# Patient Record
Sex: Male | Born: 1965 | Race: White | Hispanic: No | Marital: Married | Smoking: Never smoker
Health system: Southern US, Community
[De-identification: ages and names within clinical notes are randomized; demographics above are authoritative.]

## PROBLEM LIST (undated history)

## (undated) DIAGNOSIS — T7840XA Allergy, unspecified, initial encounter: Secondary | ICD-10-CM

## (undated) DIAGNOSIS — E785 Hyperlipidemia, unspecified: Secondary | ICD-10-CM

## (undated) DIAGNOSIS — I10 Essential (primary) hypertension: Secondary | ICD-10-CM

## (undated) HISTORY — PX: POLYPECTOMY: SHX149

## (undated) HISTORY — DX: Hyperlipidemia, unspecified: E78.5

## (undated) HISTORY — DX: Allergy, unspecified, initial encounter: T78.40XA

## (undated) HISTORY — PX: RECONSTRUCTION OF EYELID: SHX6576

## (undated) HISTORY — DX: Essential (primary) hypertension: I10

## (undated) HISTORY — PX: COLONOSCOPY: SHX174

---

## 2004-03-05 ENCOUNTER — Emergency Department (HOSPITAL_COMMUNITY): Admission: EM | Admit: 2004-03-05 | Discharge: 2004-03-05 | Payer: Self-pay | Admitting: Emergency Medicine

## 2008-06-17 ENCOUNTER — Encounter: Admission: RE | Admit: 2008-06-17 | Discharge: 2008-06-17 | Payer: Self-pay | Admitting: Internal Medicine

## 2008-07-20 ENCOUNTER — Ambulatory Visit: Payer: Self-pay | Admitting: Cardiology

## 2008-08-24 ENCOUNTER — Ambulatory Visit: Payer: Self-pay | Admitting: Cardiology

## 2009-01-07 ENCOUNTER — Ambulatory Visit: Payer: Self-pay | Admitting: Internal Medicine

## 2009-03-18 ENCOUNTER — Ambulatory Visit: Payer: Self-pay | Admitting: Internal Medicine

## 2009-09-24 ENCOUNTER — Ambulatory Visit: Payer: Self-pay | Admitting: Internal Medicine

## 2010-04-26 ENCOUNTER — Ambulatory Visit: Payer: Self-pay | Admitting: Internal Medicine

## 2010-10-27 ENCOUNTER — Ambulatory Visit: Payer: Self-pay | Admitting: Internal Medicine

## 2011-04-11 ENCOUNTER — Other Ambulatory Visit: Payer: Self-pay | Admitting: Internal Medicine

## 2011-04-13 ENCOUNTER — Ambulatory Visit: Payer: Self-pay | Admitting: Internal Medicine

## 2011-04-25 NOTE — Assessment & Plan Note (Signed)
[facility]                            CARDIOLOGY OFFICE NOTE   NAME:Alfred Berry, Alfred Berry                         MRN:          [id number]  DATE:07/20/2008                            DOB:          [birth year]    PRIMARY CARE PHYSICIAN:  Luanna Cole. Baxley, MD   REASON FOR CONSULTATION:  Evaluate patient with shortness of breath.   HISTORY OF PRESENT ILLNESS:  The patient is a pleasant 45 year old white  gentleman without prior cardiac history.  He has been noticing shortness  of breath for some weeks.  This happens in the morning.  It happens when  he tries to exercise.  He says he gets on the treadmill at the gym and  he will notice that he is more dyspneic than he thinks he should be.  He  is able to do his activity and it sounds like he does work out  reasonably well.  He gets a less short of breath as the activity goes  on.  He does not have his much of the dyspnea during the day.  He does  have some episodes at night where he feels short of breath and has to  get up.  He is not describing classic PND or orthopnea.  He says he may  be anxious as well.  He does have some episodes of his chest being heavy  when he is short of breath.  He is not describing a classic substernal  chest pressure but rather a vague mild discomfort.  He has no neck or  arm discomfort.  He is not describing any palpitations, presyncope, or  syncope.  Again, he is an active gentleman exercising a few times a  week.   PAST MEDICAL HISTORY:  He has no history of hypertension, diabetes, or  hyperlipidemia.   PAST SURGICAL HISTORY:  Cosmetic surgery in his eyes.   ALLERGIES:  He was allergic to PENICILLIN substitution years ago.   MEDICATIONS:  Claritin-D.   SOCIAL HISTORY:  The patient is a Community education officer at [employer].  He is married.  He has never had any children.  He has never smoked  cigarettes.  He does not drink alcohol.   Family history is remarkable for his father  having coronary artery  disease at his 72s and being alive at 71 with CABG.   REVIEW OF SYSTEMS:  As stated in the HPI.  Negative for all other  systems.   PHYSICAL EXAMINATION:  GENERAL:  The patient is well appearing and in no  distress.  VITAL SIGNS:  Blood pressure 120/79, heart rate 66 and regular, weight  188 pounds, and body mass index 28.5.  HEENT:  Eyes are unremarkable.  Pupils are equal, round, and reactive to  light.  Fundi visualized.  Oral mucosa unremarkable.  NECK:  No jugular venous distention, at 45 degrees.  Carotid upstroke  brisk and symmetric.  No bruits and no thyromegaly.  LYMPHATICS:  No cervical, axillary, or inguinal adenopathy.  LUNGS:  Clear to auscultation bilaterally.  BACK:  No costovertebral tenderness.  CHEST:  Unremarkable.  HEART:  PMI not displaced or sustained and S1 and S2 within normal  limits.  No S3, no S4, no clicks, no rubs, and no murmurs.  ABDOMEN:  Flat, positive bowel sounds, and normal in frequency and  pitch.  No bruits, rebound, or guarding, no midline pulsatile mass, no  hepatosplenomegaly, and no splenomegaly.  SKIN:  No rashes and no nodules.  EXTREMITIES:  A 2+ pulses throughout.  No edema, cyanosis, or clubbing.  NEURO:  Oriented to person, place, and time.  Cranial nerves II through  XII grossly intact.  Motor grossly intact throughout.   EKG sinus rhythm, rate 73, axes within normal limits, intervals within  normal limits, and nonspecific T-wave flattening in inferior and lateral  leads.   ASSESSMENT AND PLAN:  1. Dyspnea.  The patient has some dyspnea, that is, somewhat atypical.      He does have a strong family history of coronary artery disease.  I      could not exclude obstructive coronary artery disease as an      etiology though I think the pretest probability is somewhat low.  I      am going to perform a plain old exercise treadmill (PLET ).  This      will allow me to judge his dyspnea and check oxygen sats  while he      is ambulating.  This will let me to look at his blood pressure to      make sure this is not going up and to screen for obstructive      coronary artery disease.  More importantly, it will allow me to      risk stratify and most importantly give him prescription for      exercise.  With that family history, I would pursue aggressive      primary risk reduction.  2. Dyslipidemia.  He has an LDL of 147.  His HDL was 43.      Triglycerides 123 and total 215.  We discussed the risks and      benefits of statins given his family history.  Though it is outside      of the guidelines, I would suggest statin therapy if he could not      substantially reduce his LDL by diet and lifestyle changes.  He is      going to consider this and we will discuss it again at the time of      his PLET.  3. Followup.  As above.     Rollene Rotunda, MD, [facility]  Electronically Signed    JH/MedQ  DD: 07/20/2008  DT: 07/21/2008  Job #: [id number]   cc:   Luanna Cole. Lenord Fellers, M.D.

## 2011-04-25 NOTE — Procedures (Signed)
[facility]                              EXERCISE TREADMILL   NAME:Berry, Alfred                         MRN:          [id number]  DATE:08/24/2008                            DOB:          11/01/1966    PRIMARY CARE PHYSICIAN:  Luanna Cole. Baxley, MD   PROCEDURE:  Exercise treadmill test.   INDICATIONS:  The patient with dyspnea and on multiple cardiovascular  risk factors.   PROCEDURE NOTE:  The patient was excised using an accelerated Bruce  protocol.  He achieved 13.7 METs.  He exercises for 11 minutes.  He  completed stage IV.  He achieved target heart rate of 100 with a peak of  166, which was 93% of predicted.  Test was terminated because he  achieved target heart rate and because of dyspnea.  He could have  continued.   There was no chest discomfort.  He had appropriate dyspnea.  He had an  appropriate blood pressure response with max of 184/85.  There were no  ectopy.  There was no ischemic ST-T wave changes.  He had a normal heart  rate recovery.   CONCLUSION:  Negative adequate exercise treadmill test with an excellent  exercise tolerance.  There were no findings consistent with high-grade  obstructive coronary artery disease.   PLAN:  Based on the above, the patient is in low-risk category for  future cardiovascular events.  He did not have any dyspnea, which has  been his complaint.  I told him that if he continues to have this  complaint with exercise, we might consider cardiopulmonary stress test.  He could have something like exercise-induced asthma.  For now, he has  prescription for exercise based on this study.  He can continue with his  regimen and primary risk reduction.  He is going to have his lipids  checked again by Dr. Lenord Fellers after he has been dieting.  Consideration  can then be given to any further therapy for this given his family  history.  He will come back to this clinic as needed for issues as  above.     Rollene Rotunda, MD, [facility]  Electronically Signed    JH/MedQ  DD: 08/24/2008  DT: 08/25/2008  Job #: [id number]   cc:   Luanna Cole. Lenord Fellers, M.D.

## 2011-05-01 ENCOUNTER — Encounter: Payer: Self-pay | Admitting: Internal Medicine

## 2011-05-01 ENCOUNTER — Ambulatory Visit (INDEPENDENT_AMBULATORY_CARE_PROVIDER_SITE_OTHER): Payer: BC Managed Care – PPO | Admitting: Internal Medicine

## 2011-05-01 VITALS — BP 110/80 | HR 80 | Temp 98.3°F | Wt 193.0 lb

## 2011-05-01 DIAGNOSIS — E785 Hyperlipidemia, unspecified: Secondary | ICD-10-CM

## 2011-05-01 NOTE — Patient Instructions (Signed)
Take meds as directed. Return in 6 months 

## 2011-05-01 NOTE — Progress Notes (Signed)
  Subjective:    Patient ID: Alfred Berry, male    DOB: [birth year], 45 y.o.   MRN: [id number]  HPI  For 6 month follow up of hyperlipidemia on generic Zocor 20 mg daily. No complaints on medicine. Previous weight 6 months ago was 1871/2.  Going to [place] for 7 weeks.    Review of Systems     Objective:   Physical Exam  Chest clear. Cor RRR with no murmur. Ext without edema.        Assessment & Plan:  1- Hyperlipidemia stable on current regimen.  Labs reviewed LDL is 105 and prviously was 94

## 2011-08-15 ENCOUNTER — Other Ambulatory Visit: Payer: Self-pay | Admitting: *Deleted

## 2011-08-15 MED ORDER — SIMVASTATIN 20 MG PO TABS: 20.0000 mg | ORAL_TABLET | Freq: Every day | ORAL | Status: DC

## 2011-11-07 ENCOUNTER — Other Ambulatory Visit: Payer: BC Managed Care – PPO | Admitting: Internal Medicine

## 2011-11-08 ENCOUNTER — Other Ambulatory Visit (INDEPENDENT_AMBULATORY_CARE_PROVIDER_SITE_OTHER): Payer: BC Managed Care – PPO | Admitting: Internal Medicine

## 2011-11-08 DIAGNOSIS — E785 Hyperlipidemia, unspecified: Secondary | ICD-10-CM

## 2011-11-08 DIAGNOSIS — Z79899 Other long term (current) drug therapy: Secondary | ICD-10-CM

## 2011-11-08 DIAGNOSIS — Z0000 Encounter for general adult medical examination without abnormal findings: Secondary | ICD-10-CM

## 2011-11-08 LAB — COMPREHENSIVE METABOLIC PANEL
ALT: 24 U/L (ref 0–53)
AST: 22 U/L (ref 0–37)
Alkaline Phosphatase: 31 U/L — ABNORMAL LOW (ref 39–117)
Creat: 0.96 mg/dL (ref 0.50–1.35)
Total Bilirubin: 0.7 mg/dL (ref 0.3–1.2)

## 2011-11-08 LAB — LIPID PANEL
LDL Cholesterol: 104 mg/dL — ABNORMAL HIGH (ref 0–99)
Total CHOL/HDL Ratio: 3.6 Ratio
VLDL: 23 mg/dL (ref 0–40)

## 2011-11-09 ENCOUNTER — Ambulatory Visit (INDEPENDENT_AMBULATORY_CARE_PROVIDER_SITE_OTHER): Payer: BC Managed Care – PPO | Admitting: Internal Medicine

## 2011-11-09 VITALS — BP 138/84 | Temp 97.5°F | Ht 67.5 in | Wt 188.0 lb

## 2011-11-09 DIAGNOSIS — E785 Hyperlipidemia, unspecified: Secondary | ICD-10-CM

## 2011-11-09 DIAGNOSIS — Z0000 Encounter for general adult medical examination without abnormal findings: Secondary | ICD-10-CM

## 2011-11-09 DIAGNOSIS — Z23 Encounter for immunization: Secondary | ICD-10-CM

## 2011-11-09 LAB — CBC WITH DIFFERENTIAL/PLATELET
Basophils Absolute: 0 10*3/uL (ref 0.0–0.1)
Eosinophils Absolute: 0.1 10*3/uL (ref 0.0–0.7)
Eosinophils Relative: 2 % (ref 0–5)
MCH: 31.6 pg (ref 26.0–34.0)
MCV: 93.7 fL (ref 78.0–100.0)
Neutrophils Relative %: 64 % (ref 43–77)
Platelets: 267 10*3/uL (ref 150–400)
RDW: 12.9 % (ref 11.5–15.5)
WBC: 6.1 10*3/uL (ref 4.0–10.5)

## 2011-11-09 LAB — POCT URINALYSIS DIPSTICK
Spec Grav, UA: 1.02
Urobilinogen, UA: NEGATIVE

## 2011-11-12 ENCOUNTER — Encounter: Payer: Self-pay | Admitting: Internal Medicine

## 2011-11-12 DIAGNOSIS — E785 Hyperlipidemia, unspecified: Secondary | ICD-10-CM | POA: Insufficient documentation

## 2011-11-12 NOTE — Patient Instructions (Signed)
Continue with Zocor as previously prescribed and return in 6 months for fasting lab work and office visit. You have been given influenza immunization today. Try the diet exercise and lose weight

## 2011-11-12 NOTE — Progress Notes (Signed)
  Subjective:    Patient ID: Alfred Berry, male    DOB: [birth year], 45 y.o.   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 with history of hyperlipidemia and 4 evaluation. Patient currently on Zocor 20 mg daily since January 2010. History of mild obesity. History of borderline hypertension. History of allergic rhinitis. Had cosmetic eye surgery 1975.  Social history does not smoke social alcohol consumption. Married to our history professor who also teaches at [employer]. No children.  Family history father with history of coronary artery bypass surgery in 2002. Patient's father was adopted so not much known about paternal family history. Mother living in good health. Father had history of hypertension.    Review of Systems  Constitutional: Negative.   HENT: Negative.   Eyes: Negative.   Respiratory: Negative.   Cardiovascular: Negative.   Gastrointestinal: Negative.   Genitourinary: Negative.   Musculoskeletal: Negative.   Neurological: Negative.   Hematological: Negative.   Psychiatric/Behavioral: Negative.        Objective:   Physical Exam  Vitals reviewed. Constitutional: He is oriented to person, place, and time. He appears well-nourished.  HENT:  Head: Normocephalic.  Right Ear: External ear normal.  Left Ear: External ear normal.  Mouth/Throat: Oropharynx is clear and moist.  Eyes: Conjunctivae are normal. Pupils are equal, round, and reactive to light. No scleral icterus.  Neck: Neck supple. No JVD present. No thyromegaly present.  Cardiovascular: Normal rate, regular rhythm, normal heart sounds and intact distal pulses.   No murmur heard. Pulmonary/Chest: Effort normal and breath sounds normal. He has no wheezes. He has no rales.  Abdominal: Soft. Bowel sounds are normal. There is no tenderness. There is no rebound.  Genitourinary: Prostate normal.  Musculoskeletal: He exhibits no edema.  Lymphadenopathy:    He has no cervical  adenopathy.  Neurological: He is alert and oriented to person, place, and time. He has normal reflexes. No cranial nerve deficit. Coordination normal.  Skin: Skin is warm and dry.  Psychiatric: He has a normal mood and affect. His behavior is normal. Thought content normal.          Assessment & Plan:  Hyperlipidemia on statin therapy  Plan: Return in 6 months for fasting lipid panel liver functions and office visit. Patient needs to try the diet and exercise but hasn't had much time this semester. Needs to lose a will be away. We continued to monitor his blood pressure as it is borderline elevated.

## 2011-11-27 ENCOUNTER — Other Ambulatory Visit: Payer: Self-pay

## 2011-11-27 MED ORDER — SIMVASTATIN 20 MG PO TABS: 20.0000 mg | ORAL_TABLET | Freq: Every day | ORAL | Status: DC

## 2012-04-22 ENCOUNTER — Other Ambulatory Visit: Payer: BC Managed Care – PPO | Admitting: Internal Medicine

## 2012-04-22 DIAGNOSIS — E785 Hyperlipidemia, unspecified: Secondary | ICD-10-CM

## 2012-04-22 DIAGNOSIS — Z79899 Other long term (current) drug therapy: Secondary | ICD-10-CM

## 2012-04-22 LAB — HEPATIC FUNCTION PANEL
ALT: 16 U/L (ref 0–53)
Bilirubin, Direct: 0.1 mg/dL (ref 0.0–0.3)
Indirect Bilirubin: 0.6 mg/dL (ref 0.0–0.9)

## 2012-04-22 LAB — LIPID PANEL
Cholesterol: 193 mg/dL (ref 0–200)
VLDL: 20 mg/dL (ref 0–40)

## 2012-04-23 ENCOUNTER — Ambulatory Visit: Payer: BC Managed Care – PPO | Admitting: Internal Medicine

## 2012-04-26 ENCOUNTER — Ambulatory Visit (INDEPENDENT_AMBULATORY_CARE_PROVIDER_SITE_OTHER): Payer: BC Managed Care – PPO | Admitting: Internal Medicine

## 2012-04-26 ENCOUNTER — Encounter: Payer: Self-pay | Admitting: Internal Medicine

## 2012-04-26 VITALS — BP 124/78 | HR 80 | Temp 97.6°F | Wt 188.0 lb

## 2012-04-26 DIAGNOSIS — E785 Hyperlipidemia, unspecified: Secondary | ICD-10-CM

## 2012-04-26 DIAGNOSIS — S86912A Strain of unspecified muscle(s) and tendon(s) at lower leg level, left leg, initial encounter: Secondary | ICD-10-CM

## 2012-04-26 DIAGNOSIS — S83419A Sprain of medial collateral ligament of unspecified knee, initial encounter: Secondary | ICD-10-CM

## 2012-04-26 NOTE — Patient Instructions (Addendum)
Continue same dose of Zocor and return for PE in 6 months. Watch diet. Ice knee down for 20 minutes daily x 2 weeks.

## 2012-04-26 NOTE — Progress Notes (Signed)
  Subjective:    Patient ID: Alfred Berry, male    DOB: 1966-07-24, 46 y.o.   MRN: [id number]  HPI For follow up of hyperlipidemia and new complaint of left knee sprain. 2 weeks ago twisted left knee outward. Getting better.    Review of Systems     Objective:   Physical Exam No effusion. Tender along medial collateral ligament        Assessment & Plan:  Hyperlipidemia- LFTs normal but total cholesterol and LDL cholesterol has increased despite 20mg  Zocor. Has been given a different generic brand 2 weeks ago by pharmacy and has been eating cheese.  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  Continue same dose of   Zocor  for now. Watch diet, exercise. RTC in 6 months for CPE.

## 2012-05-24 ENCOUNTER — Ambulatory Visit (INDEPENDENT_AMBULATORY_CARE_PROVIDER_SITE_OTHER): Payer: BC Managed Care – PPO | Admitting: Internal Medicine

## 2012-05-24 ENCOUNTER — Encounter: Payer: Self-pay | Admitting: Internal Medicine

## 2012-05-24 VITALS — BP 124/78 | HR 80 | Temp 98.5°F | Ht 67.0 in | Wt 187.0 lb

## 2012-05-24 DIAGNOSIS — J309 Allergic rhinitis, unspecified: Secondary | ICD-10-CM

## 2012-05-24 DIAGNOSIS — H6691 Otitis media, unspecified, right ear: Secondary | ICD-10-CM

## 2012-05-24 DIAGNOSIS — H6690 Otitis media, unspecified, unspecified ear: Secondary | ICD-10-CM

## 2012-05-24 MED ORDER — METHYLPREDNISOLONE ACETATE 80 MG/ML IJ SUSP
80.0000 mg | Freq: Once | INTRAMUSCULAR | Status: AC
  Administered 2012-05-24: 80 mg via INTRAMUSCULAR

## 2012-05-24 NOTE — Progress Notes (Signed)
  Subjective:    Patient ID: Alfred Berry, male    DOB: 08-03-1966, 46 y.o.   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 Says that allergy symptoms were rather severe in [place]. When he returned home he noticed some pain in his right ear and sinus congestion which has persisted. This is been about a week. No fever or chills. No sore throat. No discolored nasal drainage. With regard to knee pain. He has been icing it Berry daily and the pain has improved    Review of Systems     Objective:   Physical Exam right TM is full but not red; left TM okay. Parents clear. Neck is supple without adenopathy. Chest clear.        Assessment & Plan:  Right otitis media-prescription for Zithromax Z-Pak take 2 tablets day one followed by 1 tablet days 2 through 5  Allergic rhinitis-plan is to treat with Depo-Medrol 80 mg IM given in office   Call if not better in one week.

## 2012-05-24 NOTE — Patient Instructions (Addendum)
He had been given an injection of Depo-Medrol for allergic rhinitis. Take Zithromax 2 tablets day one followed by 1 tablet days 2 through 5. Call if not better in one week.

## 2012-05-26 ENCOUNTER — Encounter: Payer: Self-pay | Admitting: Internal Medicine

## 2012-06-03 ENCOUNTER — Encounter: Payer: Self-pay | Admitting: Internal Medicine

## 2012-06-03 ENCOUNTER — Ambulatory Visit (INDEPENDENT_AMBULATORY_CARE_PROVIDER_SITE_OTHER): Payer: BC Managed Care – PPO | Admitting: Internal Medicine

## 2012-06-03 VITALS — BP 132/90 | Temp 98.0°F | Ht 67.0 in | Wt 187.0 lb

## 2012-06-03 DIAGNOSIS — H6120 Impacted cerumen, unspecified ear: Secondary | ICD-10-CM

## 2012-06-03 DIAGNOSIS — H6690 Otitis media, unspecified, unspecified ear: Secondary | ICD-10-CM

## 2012-06-03 DIAGNOSIS — H6122 Impacted cerumen, left ear: Secondary | ICD-10-CM

## 2012-06-03 DIAGNOSIS — J309 Allergic rhinitis, unspecified: Secondary | ICD-10-CM

## 2012-06-03 DIAGNOSIS — H6691 Otitis media, unspecified, right ear: Secondary | ICD-10-CM

## 2012-06-03 NOTE — Progress Notes (Signed)
  Subjective:    Patient ID: Dermot Gremillion, male    DOB: [birth year], 46 y.o.   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 He called saying left ear still felt intermittently stopped up. Still has considerable postnasal drip. At last visit was placed on a Zithromax Z-Pak and was given injection of Depo-Medrol IM for allergic rhinitis and right otitis media. Says it took several days however he began to feel better but intermittently over the past week has some issues with left ear being stopped up. No fever or chills. No discolored nasal drainage.    Review of Systems     Objective:   Physical Exam He has boggy nasal mucosa. Right TM slightly dull. Left TM obscured by large piece of cerumen. Patient underwent irrigation for some 20 minutes before cerumen was removed with a curette. It was a large irregular piece of cerumen. Left TM appears to be  normal. Pharynx is clear. Neck is supple. Chest is clear.        Assessment & Plan:  Impacted cerumen left external ear canal  Right otitis media-resolving  Allergic rhinitis  Plan: Sterapred DS 10 mg 6 day dosepak prescribed. Samples of Avelox 400 mg daily for 6 days. Samples of Nasonex nasal spray 2 sprays in each nostril daily. If symptoms persist, refer to allergist for allergy testing.

## 2012-06-03 NOTE — Patient Instructions (Addendum)
Take prednisone 10 mg dosepak as directed for 6 days. Take Avelox 400 mg daily for 6 days. Use Nasonex 2 sprays in each nostril daily. If symptoms persist, we will refer you to allergist. A large piece of cerumen was removed from your left ear canal today

## 2012-06-19 ENCOUNTER — Other Ambulatory Visit: Payer: Self-pay

## 2012-06-19 MED ORDER — SIMVASTATIN 20 MG PO TABS: 20.0000 mg | ORAL_TABLET | Freq: Every day | ORAL | Status: DC

## 2012-07-04 ENCOUNTER — Other Ambulatory Visit: Payer: Self-pay

## 2012-07-04 MED ORDER — MOMETASONE FUROATE 50 MCG/ACT NA SUSP: 2.0000 | Freq: Every day | NASAL | Status: DC

## 2012-08-02 ENCOUNTER — Encounter: Payer: Self-pay | Admitting: Internal Medicine

## 2012-08-02 ENCOUNTER — Ambulatory Visit (INDEPENDENT_AMBULATORY_CARE_PROVIDER_SITE_OTHER): Payer: BC Managed Care – PPO | Admitting: Internal Medicine

## 2012-08-02 VITALS — BP 136/90 | HR 80 | Temp 98.7°F | Ht 67.5 in | Wt 188.5 lb

## 2012-08-02 DIAGNOSIS — H938X9 Other specified disorders of ear, unspecified ear: Secondary | ICD-10-CM

## 2012-08-02 NOTE — Patient Instructions (Addendum)
Appointment with ENT physician to stay August 27. Continue Nasonex and over-the-counter antihistamine

## 2012-08-02 NOTE — Progress Notes (Signed)
  Subjective:    Patient ID: Alfred Berry, male    DOB: 02-Sep-1966, 46 y.o.   MRN: [id number]  HPI He is here today saying that he still feels like his ears are underwater. Has been using Nasonex once daily and taking over-the-counter antihistamine. Feels that his nasal congestion has improved but ears still feel stopped up although they did feel better for short period of time. We've been dealing this for some time now. He is planning a trip to [place] on Wednesday for a conference and thinks he may be in for an uncomfortable plane ride if his ears remain stopped up. He has never been allergy tested.    Review of Systems     Objective:   Physical Exam he has a very small amount of wax in the external ear canals that is not obstructing his hearing. Both TMs look to be normal to me. They are not red or full. He has boggy nasal mucosa bilaterally. His pharynx is clear. Neck is supple. Chest clear.        Assessment & Plan:  Persistent complaint of ear congestion  Plan: Refer next week to ENT physician before plane trip to [place]. Continue Nasonex and antihistamine over-the-counter. Consider allergy testing.

## 2012-11-12 ENCOUNTER — Other Ambulatory Visit: Payer: BC Managed Care – PPO | Admitting: Internal Medicine

## 2012-11-12 DIAGNOSIS — Z0000 Encounter for general adult medical examination without abnormal findings: Secondary | ICD-10-CM

## 2012-11-12 DIAGNOSIS — E785 Hyperlipidemia, unspecified: Secondary | ICD-10-CM

## 2012-11-13 LAB — LIPID PANEL
Cholesterol: 155 mg/dL (ref 0–200)
Total CHOL/HDL Ratio: 2.9 Ratio
Triglycerides: 53 mg/dL (ref <150)
VLDL: 11 mg/dL (ref 0–40)

## 2012-11-13 LAB — CBC WITH DIFFERENTIAL/PLATELET
Eosinophils Absolute: 0.1 10*3/uL (ref 0.0–0.7)
Eosinophils Relative: 1 % (ref 0–5)
Hemoglobin: 15.2 g/dL (ref 13.0–17.0)
Lymphs Abs: 1.6 10*3/uL (ref 0.7–4.0)
MCH: 31.7 pg (ref 26.0–34.0)
MCV: 94.4 fL (ref 78.0–100.0)
Monocytes Absolute: 0.4 10*3/uL (ref 0.1–1.0)
Monocytes Relative: 9 % (ref 3–12)
Platelets: 265 10*3/uL (ref 150–400)
RBC: 4.8 MIL/uL (ref 4.22–5.81)

## 2012-11-13 LAB — COMPREHENSIVE METABOLIC PANEL
BUN: 16 mg/dL (ref 6–23)
CO2: 29 mEq/L (ref 19–32)
Creat: 0.96 mg/dL (ref 0.50–1.35)
Glucose, Bld: 86 mg/dL (ref 70–99)
Total Bilirubin: 0.8 mg/dL (ref 0.3–1.2)

## 2012-11-14 ENCOUNTER — Encounter: Payer: Self-pay | Admitting: Internal Medicine

## 2012-11-14 ENCOUNTER — Ambulatory Visit (INDEPENDENT_AMBULATORY_CARE_PROVIDER_SITE_OTHER): Payer: BC Managed Care – PPO | Admitting: Internal Medicine

## 2012-11-14 VITALS — BP 122/74 | HR 80 | Temp 97.1°F | Ht 68.0 in | Wt 187.0 lb

## 2012-11-14 DIAGNOSIS — E785 Hyperlipidemia, unspecified: Secondary | ICD-10-CM

## 2012-11-14 DIAGNOSIS — J309 Allergic rhinitis, unspecified: Secondary | ICD-10-CM

## 2012-11-14 NOTE — Patient Instructions (Addendum)
Continue same medications and return in 6 months 

## 2012-12-15 ENCOUNTER — Encounter: Payer: Self-pay | Admitting: Internal Medicine

## 2012-12-15 DIAGNOSIS — J309 Allergic rhinitis, unspecified: Secondary | ICD-10-CM | POA: Insufficient documentation

## 2012-12-15 NOTE — Progress Notes (Signed)
47 year old white married male Subjective:    Patient ID: Alfred Berry, male    DOB: [birth year], 47 y.o.   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 He has a history of hyperlipidemia and has been on Zocor 20 mg daily since January 2010. History of mild obesity and borderline hypertension. History of allergic rhinitis. He had cosmetic eye surgery 1975. History of allergic rhinitis.  Social history: He is married to an art history professor who also teaches at [employer]. They have no children. He does not smoke. Social alcohol consumption.  Family history: Father with history of coronary artery bypass surgery in 2002. Patient's father was adopted so not much is known about patient's paternal family history. Mother living in good health. Father had history of hypertension.    Review of Systems  Constitutional: Negative.   HENT: Negative.   Eyes: Negative.   Respiratory: Negative.   Cardiovascular: Negative.   Gastrointestinal: Negative.   Genitourinary: Negative.   Musculoskeletal: Negative.   Neurological: Negative.   Hematological: Negative.   Psychiatric/Behavioral: Negative.        Objective:   Physical Exam  Vitals reviewed. Constitutional: He is oriented to person, place, and time. He appears well-developed and well-nourished. No distress.  HENT:  Head: Normocephalic and atraumatic.  Right Ear: External ear normal.  Left Ear: External ear normal.  Mouth/Throat: Oropharynx is clear and moist. No oropharyngeal exudate.  Eyes: Conjunctivae normal and EOM are normal. Pupils are equal, round, and reactive to light.  Neck: Neck supple. No JVD present. No thyromegaly present.  Cardiovascular: Normal rate, regular rhythm, normal heart sounds and intact distal pulses.   No murmur heard. Pulmonary/Chest: Effort normal and breath sounds normal. No respiratory distress. He has no wheezes. He has  no rales. He exhibits no tenderness.  Abdominal: Soft. Bowel sounds are normal. He exhibits no distension and no mass. There is no tenderness. There is no rebound and no guarding.  Genitourinary: Prostate normal.  Musculoskeletal: Normal range of motion. He exhibits no edema.  Lymphadenopathy:    He has no cervical adenopathy.  Neurological: He is alert and oriented to person, place, and time. He has normal reflexes. No cranial nerve deficit. Coordination normal.  Skin: Skin is warm and dry. No rash noted. He is not diaphoretic.  Psychiatric: He has a normal mood and affect. His behavior is normal. Judgment and thought content normal.          Assessment & Plan:  Hyperlipidemia-stable on statin therapy  Mild obesity  Allergic rhinitis  Borderline hypertension-blood pressure is normal today  Plan: Return in 6 months for office visit blood pressure check, weight check, lipid panel, and liver functions. Encouraged diet exercise.

## 2012-12-31 ENCOUNTER — Other Ambulatory Visit: Payer: Self-pay | Admitting: Internal Medicine

## 2013-03-26 ENCOUNTER — Other Ambulatory Visit: Payer: Self-pay

## 2013-03-26 ENCOUNTER — Other Ambulatory Visit: Payer: Self-pay | Admitting: Internal Medicine

## 2013-03-26 MED ORDER — SIMVASTATIN 20 MG PO TABS: 20.0000 mg | ORAL_TABLET | Freq: Every day | ORAL | Status: DC

## 2013-04-22 ENCOUNTER — Other Ambulatory Visit: Payer: Self-pay | Admitting: Internal Medicine

## 2013-04-22 ENCOUNTER — Other Ambulatory Visit: Payer: BC Managed Care – PPO | Admitting: Internal Medicine

## 2013-04-22 DIAGNOSIS — Z79899 Other long term (current) drug therapy: Secondary | ICD-10-CM

## 2013-04-22 DIAGNOSIS — E785 Hyperlipidemia, unspecified: Secondary | ICD-10-CM

## 2013-04-22 LAB — HEPATIC FUNCTION PANEL
Albumin: 4.3 g/dL (ref 3.5–5.2)
Alkaline Phosphatase: 30 U/L — ABNORMAL LOW (ref 39–117)
Indirect Bilirubin: 0.5 mg/dL (ref 0.0–0.9)
Total Bilirubin: 0.6 mg/dL (ref 0.3–1.2)

## 2013-04-22 LAB — LIPID PANEL
LDL Cholesterol: 96 mg/dL (ref 0–99)
VLDL: 14 mg/dL (ref 0–40)

## 2013-04-24 ENCOUNTER — Ambulatory Visit (INDEPENDENT_AMBULATORY_CARE_PROVIDER_SITE_OTHER): Payer: BC Managed Care – PPO | Admitting: Internal Medicine

## 2013-04-24 ENCOUNTER — Encounter: Payer: Self-pay | Admitting: Internal Medicine

## 2013-04-24 VITALS — BP 118/78 | Temp 97.2°F | Wt 192.0 lb

## 2013-04-24 DIAGNOSIS — D1722 Benign lipomatous neoplasm of skin and subcutaneous tissue of left arm: Secondary | ICD-10-CM

## 2013-04-24 DIAGNOSIS — E785 Hyperlipidemia, unspecified: Secondary | ICD-10-CM

## 2013-04-24 DIAGNOSIS — D1739 Benign lipomatous neoplasm of skin and subcutaneous tissue of other sites: Secondary | ICD-10-CM

## 2013-04-24 DIAGNOSIS — N529 Male erectile dysfunction, unspecified: Secondary | ICD-10-CM

## 2013-04-24 DIAGNOSIS — G4700 Insomnia, unspecified: Secondary | ICD-10-CM

## 2013-04-24 MED ORDER — ALPRAZOLAM 0.5 MG PO TABS: 0.5000 mg | ORAL_TABLET | Freq: Two times a day (BID) | ORAL | Status: DC | PRN

## 2013-04-24 MED ORDER — ALPRAZOLAM 1 MG PO TABS: 1.0000 mg | ORAL_TABLET | Freq: Two times a day (BID) | ORAL | Status: DC | PRN

## 2013-05-11 ENCOUNTER — Encounter: Payer: Self-pay | Admitting: Internal Medicine

## 2013-05-11 NOTE — Progress Notes (Signed)
  Subjective:    Patient ID: Alfred Berry, male    DOB: 14-Apr-1966, 47 y.o.   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 History of hyperlipidemia, rectal dysfunction, insomnia. He and his wife are planning on spending several weeks in [place]. 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 He plans to do some writing and they both had to travel a bit while there. He is concerned about lesion on left arm which is unchanged.    Review of Systems     Objective:   Physical Exam Small lipoma left arm. Neck is supple without thyromegaly JVD or carotid bruits. Chest clear to auscultation. Cardiac exam regular rate and rhythm normal S1 and S2. Extremities without edema.       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

## 2013-05-11 NOTE — Patient Instructions (Addendum)
Continue same medications and return in 6 months 

## 2013-07-28 ENCOUNTER — Other Ambulatory Visit: Payer: Self-pay | Admitting: Internal Medicine

## 2013-12-15 ENCOUNTER — Other Ambulatory Visit: Payer: Self-pay | Admitting: Internal Medicine

## 2014-06-11 ENCOUNTER — Other Ambulatory Visit: Payer: BC Managed Care – PPO | Admitting: Internal Medicine

## 2014-06-11 DIAGNOSIS — Z0000 Encounter for general adult medical examination without abnormal findings: Secondary | ICD-10-CM

## 2014-06-11 DIAGNOSIS — Z79899 Other long term (current) drug therapy: Secondary | ICD-10-CM

## 2014-06-11 DIAGNOSIS — E785 Hyperlipidemia, unspecified: Secondary | ICD-10-CM

## 2014-06-11 LAB — COMPREHENSIVE METABOLIC PANEL
ALT: 21 U/L (ref 0–53)
AST: 20 U/L (ref 0–37)
Albumin: 4.4 g/dL (ref 3.5–5.2)
Alkaline Phosphatase: 29 U/L — ABNORMAL LOW (ref 39–117)
BILIRUBIN TOTAL: 0.8 mg/dL (ref 0.2–1.2)
BUN: 11 mg/dL (ref 6–23)
CO2: 29 mEq/L (ref 19–32)
CREATININE: 0.98 mg/dL (ref 0.50–1.35)
Calcium: 9.2 mg/dL (ref 8.4–10.5)
Chloride: 103 mEq/L (ref 96–112)
Glucose, Bld: 92 mg/dL (ref 70–99)
Potassium: 4.5 mEq/L (ref 3.5–5.3)
Sodium: 139 mEq/L (ref 135–145)
Total Protein: 6.9 g/dL (ref 6.0–8.3)

## 2014-06-11 LAB — CBC WITH DIFFERENTIAL/PLATELET
BASOS ABS: 0 10*3/uL (ref 0.0–0.1)
Basophils Relative: 1 % (ref 0–1)
EOS ABS: 0.1 10*3/uL (ref 0.0–0.7)
EOS PCT: 2 % (ref 0–5)
HCT: 44.1 % (ref 39.0–52.0)
Hemoglobin: 15.5 g/dL (ref 13.0–17.0)
LYMPHS PCT: 42 % (ref 12–46)
Lymphs Abs: 1.6 10*3/uL (ref 0.7–4.0)
MCH: 32 pg (ref 26.0–34.0)
MCHC: 35.1 g/dL (ref 30.0–36.0)
MCV: 91.1 fL (ref 78.0–100.0)
Monocytes Absolute: 0.4 10*3/uL (ref 0.1–1.0)
Monocytes Relative: 10 % (ref 3–12)
NEUTROS PCT: 45 % (ref 43–77)
Neutro Abs: 1.8 10*3/uL (ref 1.7–7.7)
PLATELETS: 252 10*3/uL (ref 150–400)
RBC: 4.84 MIL/uL (ref 4.22–5.81)
RDW: 13.8 % (ref 11.5–15.5)
WBC: 3.9 10*3/uL — AB (ref 4.0–10.5)

## 2014-06-11 LAB — LIPID PANEL
CHOL/HDL RATIO: 3.7 ratio
Cholesterol: 199 mg/dL (ref 0–200)
HDL: 54 mg/dL (ref >39)
LDL CALC: 125 mg/dL — AB (ref 0–99)
Triglycerides: 101 mg/dL (ref <150)
VLDL: 20 mg/dL (ref 0–40)

## 2014-06-15 ENCOUNTER — Encounter: Payer: Self-pay | Admitting: Internal Medicine

## 2014-06-15 ENCOUNTER — Ambulatory Visit (INDEPENDENT_AMBULATORY_CARE_PROVIDER_SITE_OTHER): Payer: BC Managed Care – PPO | Admitting: Internal Medicine

## 2014-06-15 VITALS — BP 126/84 | HR 80 | Temp 98.1°F | Ht 68.5 in | Wt 194.5 lb

## 2014-06-15 DIAGNOSIS — E785 Hyperlipidemia, unspecified: Secondary | ICD-10-CM

## 2014-06-15 NOTE — Progress Notes (Signed)
   Subjective:    Patient ID: Alfred Berry, male    DOB: 21-Jul-1966, 48 y.o.   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 He has a history of hyperlipidemia treated with Zocor since January 2010. History of mild obesity and borderline hypertension. History of allergic rhinitis. Had cosmetic eye surgery 1975.  Social history: Does not smoke. Social alcohol consumption. Married to an art history professor who also teaches at [employer]. No children.  Family history: Father with history of coronary artery bypass surgery in 2002 and history of hypertension. Patient's father was adopted so not much is known about paternal family history. Mother living in good health.    Review of Systems  Constitutional: Negative.   HENT: Negative.   Eyes: Negative.   Respiratory: Negative.   Cardiovascular: Negative.   Gastrointestinal: Negative.   Endocrine: Negative.   Genitourinary: Negative.   Allergic/Immunologic: Negative.   Neurological: Negative.   Hematological: Negative.   Psychiatric/Behavioral: Negative.        Objective:   Physical Exam  Vitals reviewed. Constitutional: He is oriented to person, place, and time. He appears well-developed and well-nourished. No distress.  HENT:  Head: Normocephalic and atraumatic.  Right Ear: External ear normal.  Left Ear: External ear normal.  Mouth/Throat: Oropharynx is clear and moist. No oropharyngeal exudate.  Eyes: Conjunctivae and EOM are normal. Pupils are equal, round, and reactive to light. Right eye exhibits no discharge. Left eye exhibits no discharge. No scleral icterus.  Neck: Neck supple. No JVD present. No thyromegaly present.  Cardiovascular: Normal rate, regular rhythm, normal heart sounds and intact distal pulses.  Exam reveals no gallop.   No murmur heard. Pulmonary/Chest: Effort normal and breath sounds normal. He has no wheezes. He has no  rales.  Abdominal: Soft. Bowel sounds are normal. He exhibits no distension and no mass. There is no rebound and no guarding.  Genitourinary: Prostate normal.  Musculoskeletal: Normal range of motion. He exhibits no edema.  Lymphadenopathy:    He has no cervical adenopathy.  Neurological: He is alert and oriented to person, place, and time. He has normal reflexes. He displays normal reflexes. No cranial nerve deficit.  Skin: Skin is warm and dry. No rash noted. He is not diaphoretic.  Psychiatric: He has a normal mood and affect. His behavior is normal. Judgment and thought content normal.          Assessment & Plan:  Hyperlipidemia -not been taking statin medication regularly and lab work reflexes. Return in August for fasting lipid panel and take statin medication regularly. Otherwise return in one year  History of allergic rhinitis treated with Claritin  Erectile dysfunction-treated with Viagra or Cialis  Plan: Return in August for fasting lipid panel. Return in one year for physical examination.

## 2014-06-15 NOTE — Patient Instructions (Signed)
Return in August for repeat fasting lipid panel. Take statin medication regularly. Continue diet and exercise. Return in one year or as needed.

## 2014-07-07 ENCOUNTER — Other Ambulatory Visit: Payer: Self-pay | Admitting: Internal Medicine

## 2014-07-23 ENCOUNTER — Other Ambulatory Visit: Payer: BC Managed Care – PPO | Admitting: Internal Medicine

## 2014-07-23 DIAGNOSIS — E785 Hyperlipidemia, unspecified: Secondary | ICD-10-CM

## 2014-07-24 LAB — LIPID PANEL
CHOLESTEROL: 180 mg/dL (ref 0–200)
HDL: 51 mg/dL (ref >39)
LDL Cholesterol: 111 mg/dL — ABNORMAL HIGH (ref 0–99)
TRIGLYCERIDES: 89 mg/dL (ref <150)
Total CHOL/HDL Ratio: 3.5 Ratio
VLDL: 18 mg/dL (ref 0–40)

## 2014-09-01 ENCOUNTER — Other Ambulatory Visit: Payer: Self-pay | Admitting: Internal Medicine

## 2015-01-18 ENCOUNTER — Ambulatory Visit (INDEPENDENT_AMBULATORY_CARE_PROVIDER_SITE_OTHER): Payer: BC Managed Care – PPO | Admitting: Internal Medicine

## 2015-01-18 ENCOUNTER — Encounter: Payer: Self-pay | Admitting: Internal Medicine

## 2015-01-18 VITALS — BP 142/82 | HR 76 | Temp 97.3°F | Wt 192.5 lb

## 2015-01-18 DIAGNOSIS — R51 Headache: Secondary | ICD-10-CM

## 2015-01-18 DIAGNOSIS — R519 Headache, unspecified: Secondary | ICD-10-CM

## 2015-01-18 DIAGNOSIS — B029 Zoster without complications: Secondary | ICD-10-CM

## 2015-01-18 MED ORDER — VALACYCLOVIR HCL 1 G PO TABS: 1000.0000 mg | ORAL_TABLET | Freq: Three times a day (TID) | ORAL | Status: DC

## 2015-01-18 MED ORDER — HYDROCODONE-ACETAMINOPHEN 10-325 MG PO TABS: 1.0000 | ORAL_TABLET | Freq: Three times a day (TID) | ORAL | Status: DC | PRN

## 2015-01-18 NOTE — Progress Notes (Signed)
   Subjective:    Patient ID: Alfred Berry, male    DOB: 1966/04/23, 49 y.o.   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 Headache was above his left eye and top of left hand. He took some ibuprofen and didn't really get much relief. In a cast persisted. Wife noticed that his left eye was red today. No drainage from. He discovered over the weekend a lesion on his left scalp area. Has some tenderness in left neck area. No fever or chills. No visual disturbance. No nausea and vomiting.    Review of Systems     Objective:   Physical Exam He has an erythematous papular lesion top of left scalp. No distinct vesicles noted. PERRLA. Funduscopic exam is benign. Extraocular movements are full. His left eye lid looks a bit droopy. Conjunctiva left eye erythematous.       Assessment & Plan:  I think he has Herpes zoster. I'm placing him on Valtrex 1 g 3 times daily for 7 days. I like for him to have an eye exam. 4 headache him giving him Norco 10/325 one by mouth every 6- 8 hours when necessary pain.

## 2015-01-18 NOTE — Patient Instructions (Signed)
Start Valtrex 1 g 3 times daily for 7 days. Take Norco 10/325 every 6-8 hours as needed for headache. Appointment with Dr. Bing Plume regarding eye exam

## 2015-06-17 ENCOUNTER — Other Ambulatory Visit: Payer: Self-pay | Admitting: Internal Medicine

## 2015-07-12 ENCOUNTER — Other Ambulatory Visit: Payer: BC Managed Care – PPO | Admitting: Internal Medicine

## 2015-07-12 DIAGNOSIS — Z13220 Encounter for screening for lipoid disorders: Secondary | ICD-10-CM

## 2015-07-12 DIAGNOSIS — Z0000 Encounter for general adult medical examination without abnormal findings: Secondary | ICD-10-CM

## 2015-07-12 LAB — CBC WITH DIFFERENTIAL/PLATELET
Basophils Absolute: 0 10*3/uL (ref 0.0–0.1)
Basophils Relative: 0 % (ref 0–1)
EOS ABS: 0.1 10*3/uL (ref 0.0–0.7)
EOS PCT: 2 % (ref 0–5)
HEMATOCRIT: 44.6 % (ref 39.0–52.0)
HEMOGLOBIN: 15.4 g/dL (ref 13.0–17.0)
LYMPHS PCT: 35 % (ref 12–46)
Lymphs Abs: 1.6 10*3/uL (ref 0.7–4.0)
MCH: 32 pg (ref 26.0–34.0)
MCHC: 34.5 g/dL (ref 30.0–36.0)
MCV: 92.5 fL (ref 78.0–100.0)
MONO ABS: 0.5 10*3/uL (ref 0.1–1.0)
MPV: 9.8 fL (ref 8.6–12.4)
Monocytes Relative: 10 % (ref 3–12)
NEUTROS ABS: 2.5 10*3/uL (ref 1.7–7.7)
NEUTROS PCT: 53 % (ref 43–77)
PLATELETS: 249 10*3/uL (ref 150–400)
RBC: 4.82 MIL/uL (ref 4.22–5.81)
RDW: 13.2 % (ref 11.5–15.5)
WBC: 4.7 10*3/uL (ref 4.0–10.5)

## 2015-07-12 LAB — COMPLETE METABOLIC PANEL WITH GFR
ALBUMIN: 4.3 g/dL (ref 3.6–5.1)
ALK PHOS: 32 U/L — AB (ref 40–115)
ALT: 27 U/L (ref 9–46)
AST: 19 U/L (ref 10–40)
BILIRUBIN TOTAL: 0.7 mg/dL (ref 0.2–1.2)
BUN: 15 mg/dL (ref 7–25)
CALCIUM: 9.2 mg/dL (ref 8.6–10.3)
CO2: 25 mmol/L (ref 20–31)
CREATININE: 1.03 mg/dL (ref 0.60–1.35)
Chloride: 103 mmol/L (ref 98–110)
GFR, EST NON AFRICAN AMERICAN: 85 mL/min (ref >=60)
GLUCOSE: 99 mg/dL (ref 65–99)
POTASSIUM: 4.2 mmol/L (ref 3.5–5.3)
Sodium: 140 mmol/L (ref 135–146)
TOTAL PROTEIN: 6.8 g/dL (ref 6.1–8.1)

## 2015-07-12 LAB — LIPID PANEL
CHOLESTEROL: 167 mg/dL (ref 125–200)
HDL: 47 mg/dL (ref >=40)
LDL Cholesterol: 99 mg/dL (ref <130)
Total CHOL/HDL Ratio: 3.6 Ratio (ref <=5.0)
Triglycerides: 104 mg/dL (ref <150)
VLDL: 21 mg/dL (ref <30)

## 2015-07-13 ENCOUNTER — Ambulatory Visit (INDEPENDENT_AMBULATORY_CARE_PROVIDER_SITE_OTHER): Payer: BC Managed Care – PPO | Admitting: Internal Medicine

## 2015-07-13 VITALS — BP 112/80 | HR 74 | Temp 98.0°F | Ht 68.5 in | Wt 191.0 lb

## 2015-07-13 DIAGNOSIS — E785 Hyperlipidemia, unspecified: Secondary | ICD-10-CM

## 2015-07-13 DIAGNOSIS — F411 Generalized anxiety disorder: Secondary | ICD-10-CM

## 2015-07-13 DIAGNOSIS — N529 Male erectile dysfunction, unspecified: Secondary | ICD-10-CM | POA: Diagnosis not present

## 2015-07-13 DIAGNOSIS — Z0000 Encounter for general adult medical examination without abnormal findings: Secondary | ICD-10-CM | POA: Diagnosis not present

## 2015-07-13 DIAGNOSIS — Z8669 Personal history of other diseases of the nervous system and sense organs: Secondary | ICD-10-CM

## 2015-07-13 DIAGNOSIS — J309 Allergic rhinitis, unspecified: Secondary | ICD-10-CM

## 2015-07-13 DIAGNOSIS — Z8619 Personal history of other infectious and parasitic diseases: Secondary | ICD-10-CM

## 2015-07-13 LAB — POCT URINALYSIS DIPSTICK
Bilirubin, UA: NEGATIVE
Blood, UA: NEGATIVE
Glucose, UA: NEGATIVE
Ketones, UA: NEGATIVE
Leukocytes, UA: NEGATIVE
Nitrite, UA: NEGATIVE
PH UA: 5
Protein, UA: NEGATIVE
SPEC GRAV UA: 1.01
UROBILINOGEN UA: NEGATIVE

## 2015-07-21 ENCOUNTER — Other Ambulatory Visit: Payer: Self-pay | Admitting: Internal Medicine

## 2015-07-21 NOTE — Telephone Encounter (Signed)
Refill x 6 months 

## 2015-08-09 ENCOUNTER — Encounter: Payer: Self-pay | Admitting: Internal Medicine

## 2015-08-09 ENCOUNTER — Other Ambulatory Visit: Payer: Self-pay | Admitting: Internal Medicine

## 2015-08-09 NOTE — Progress Notes (Signed)
   Subjective:    Patient ID: Alfred Berry, male    DOB: 1966/09/27, 49 y.o.   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 History of hyperlipidemia treated with Zocor since January 2010. History of mild obesity and borderline hypertension. History of allergic rhinitis. Had cosmetic surgery 1975.  Social history: Does not smoke. Social alcohol consumption. 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 No children.  Family history: Father with history of coronary artery bypass surgery in 2002 and history of hypertension. Patient's father was adopted so not much is known about paternal family history. Mother living in good health.  In February 2016 had herpes zoster affecting left scalp and left eye. This resolved without residual issues. Was referred to ophthalmologist.  Review of Systems  Constitutional: Negative.   All other systems reviewed and are negative.      Objective:   Physical Exam  Constitutional: He is oriented to person, place, and time. He appears well-developed and well-nourished. No distress.  HENT:  Head: Normocephalic and atraumatic.  Right Ear: External ear normal.  Left Ear: External ear normal.  Mouth/Throat: Oropharynx is clear and moist. No oropharyngeal exudate.  Eyes: Conjunctivae and EOM are normal. Pupils are equal, round, and reactive to light. Right eye exhibits no discharge. Left eye exhibits no discharge. No scleral icterus.  Neck: Neck supple. No JVD present. No thyromegaly present.  Cardiovascular: Normal rate, regular rhythm and normal heart sounds.   No murmur heard. Pulmonary/Chest: Effort normal and breath sounds normal. No respiratory distress. He has no wheezes. He has no rales.  Abdominal: Soft. Bowel sounds are normal. He exhibits no distension and no mass. There is no tenderness. There is no rebound and no guarding.  Genitourinary: Prostate normal.    Musculoskeletal: Normal range of motion. He exhibits no edema.  Lymphadenopathy:    He has no cervical adenopathy.  Neurological: He is alert and oriented to person, place, and time. He has normal reflexes. No cranial nerve deficit. Coordination normal.  Skin: Skin is warm and dry. No rash noted. He is not diaphoretic.  Psychiatric: He has a normal mood and affect. His behavior is normal. Judgment and thought content normal.  Vitals reviewed.         Assessment & Plan:  Hyperlipidemia  History of allergic rhinitis treated with over-the-counter anti-histamine  History of erectile dysfunction treated with Viagra or Cialis  Plan: Return in one year or as needed. Tetanus immunization is up-to-date. History of mild anxiety/insomnia for which he takes when necessary Xanax infrequently.

## 2015-08-09 NOTE — Patient Instructions (Signed)
Continue same medications and return in one year or as needed. 

## 2015-09-06 ENCOUNTER — Other Ambulatory Visit: Payer: Self-pay | Admitting: Internal Medicine

## 2015-12-24 ENCOUNTER — Ambulatory Visit (INDEPENDENT_AMBULATORY_CARE_PROVIDER_SITE_OTHER): Payer: BC Managed Care – PPO | Admitting: Internal Medicine

## 2015-12-24 ENCOUNTER — Telehealth: Payer: Self-pay

## 2015-12-24 DIAGNOSIS — Z23 Encounter for immunization: Secondary | ICD-10-CM | POA: Diagnosis not present

## 2015-12-24 NOTE — Telephone Encounter (Signed)
Patient came in today for flu vaccine; scheduled CPE in early August. He is requesting to have his colonoscopy done prior to seeing you, maybe the end of July before school goes back. He wants to see Dr. Henrene Pastor which is who his wife seen. Is this ok to order?

## 2015-12-24 NOTE — Telephone Encounter (Signed)
Will set reminder for May to schedule at that time.

## 2015-12-24 NOTE — Telephone Encounter (Signed)
Not sure you can order this far in advance. He should contact us again in May. They usually require pt to go ahead see doc and schedule fairly soon after order is placed.

## 2015-12-31 ENCOUNTER — Other Ambulatory Visit: Payer: Self-pay | Admitting: Internal Medicine

## 2016-03-17 ENCOUNTER — Telehealth: Payer: Self-pay | Admitting: Internal Medicine

## 2016-03-17 NOTE — Telephone Encounter (Signed)
Patient called with right heel pain r/t plantar fascitis.  Patient wanted to know if he could have a referral to a podiatrist or if he needed to be seen by Dr. Renold Genta prior to being referred out.    Spoke with Dr. Renold Genta and she advised to go ahead and refer patient out to [facility].    LMOM on patient voice mail @ [phone] with information to [facility], their address @ [address], [place], phone [phone].  Patient advised that he can call and make his appointment for his visit.  Instructed patient to call us back with any additional questions.

## 2016-04-05 ENCOUNTER — Telehealth: Payer: Self-pay | Admitting: Internal Medicine

## 2016-04-05 MED ORDER — SILDENAFIL CITRATE 100 MG PO TABS: 50.0000 mg | ORAL_TABLET | Freq: Every day | ORAL | Status: DC | PRN

## 2016-04-05 NOTE — Telephone Encounter (Signed)
Pharmacy faxed order requesting refill on Viagra. Refill for one year.

## 2016-04-06 ENCOUNTER — Ambulatory Visit (INDEPENDENT_AMBULATORY_CARE_PROVIDER_SITE_OTHER): Payer: BC Managed Care – PPO

## 2016-04-06 ENCOUNTER — Ambulatory Visit (INDEPENDENT_AMBULATORY_CARE_PROVIDER_SITE_OTHER): Payer: BC Managed Care – PPO | Admitting: Podiatry

## 2016-04-06 ENCOUNTER — Encounter: Payer: Self-pay | Admitting: Podiatry

## 2016-04-06 VITALS — BP 121/81 | HR 66 | Resp 12

## 2016-04-06 DIAGNOSIS — M722 Plantar fascial fibromatosis: Secondary | ICD-10-CM | POA: Diagnosis not present

## 2016-04-06 MED ORDER — METHYLPREDNISOLONE 4 MG PO TBPK: ORAL_TABLET | ORAL | Status: DC

## 2016-04-06 MED ORDER — MELOXICAM 15 MG PO TABS: 15.0000 mg | ORAL_TABLET | Freq: Every day | ORAL | Status: DC

## 2016-04-06 NOTE — Progress Notes (Signed)
   Subjective:    Patient ID: Alfred Berry, male    DOB: Feb 03, 1966, 50 y.o.   MRN: [id number]  HPI: He presents today with a 3 month duration of heel pain to his right foot. He states that the first out of bed in the morning seems to be the worst he also states that he has tried anti-inflammatories to no help. He was referred here by his primary physician who diagnosed him with plantar fasciitis. 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    Review of Systems  Musculoskeletal: Positive for gait problem.  Allergic/Immunologic: Positive for environmental allergies and food allergies.       Objective:   Physical Exam: I have reviewed his past medical history medications allergies surgery social history and review of systems. He presents in no apparent distress. Vital signs are stable he is alert and oriented 3. Pulses are strongly palpable. Neurologic sensorium is intact. Deep tendon reflexes are intact and muscle strength is normal bilateral. He has pain on palpation medial calcaneal tubercle of the right heel with no pain on medial and lateral compression of the calcaneus. Radiographs demonstrate soft tissue increase in density plantar fascia calcaneal insertion site of the right heel. Cutaneous evaluation demonstrates supple well-hydrated cutis no signs of infection.        Assessment & Plan:  Plantar fasciitis right heel.  Plan: We discussed the etiology pathology conservative versus surgical therapies. We discussed appropriate shoe gear stretching exercises ice therapy and shoe gear modifications. I injected the right heel today with Kenalog and local anesthetic placed him in a plantar fascia brace and a night splint. I also started him on a Medrol Dosepak to be followed by meloxicam. I will follow-up with him in 1 month.

## 2016-04-06 NOTE — Patient Instructions (Signed)

## 2016-04-11 ENCOUNTER — Telehealth: Payer: Self-pay | Admitting: *Deleted

## 2016-04-11 NOTE — Telephone Encounter (Signed)
Pt states he was in about a week ago and didn't get the stretches and wanted to know if he should ice his feet.  I left message that pt should ice his feet 3-4 times a day for at least 10-67minutes each time and to protect his skin from the ice with a thin cloth and that I would mail his stretches tomorrow.  Mailed.

## 2016-05-23 ENCOUNTER — Ambulatory Visit: Payer: BC Managed Care – PPO | Admitting: Podiatry

## 2016-05-30 ENCOUNTER — Encounter: Payer: Self-pay | Admitting: Podiatry

## 2016-05-30 ENCOUNTER — Ambulatory Visit (INDEPENDENT_AMBULATORY_CARE_PROVIDER_SITE_OTHER): Payer: BC Managed Care – PPO | Admitting: Podiatry

## 2016-05-30 VITALS — BP 135/85 | HR 69 | Resp 12

## 2016-05-30 DIAGNOSIS — M722 Plantar fascial fibromatosis: Secondary | ICD-10-CM

## 2016-05-30 MED ORDER — MELOXICAM 15 MG PO TABS: 15.0000 mg | ORAL_TABLET | Freq: Every day | ORAL | Status: DC

## 2016-05-30 NOTE — Progress Notes (Signed)
He presents today for follow-up of his plantar fasciitis left heel. He states it is only approximately 10% improved. 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  Objective: Vital signs are stable he is alert and oriented 3 pulses are palpable. Neurologic sensorium is intact. Deep tendon reflexes are intact muscle strength is equal bilateral pain on palpation medial calcaneal tubercle intractable.  Assessment: Plantar fasciitis.  Plan: Start him back on meloxicam reinjected the heel today. He was scanned for set of orthotics before he left today.

## 2016-06-20 ENCOUNTER — Ambulatory Visit: Payer: BC Managed Care – PPO | Admitting: *Deleted

## 2016-06-20 DIAGNOSIS — M722 Plantar fascial fibromatosis: Secondary | ICD-10-CM

## 2016-06-20 NOTE — Patient Instructions (Signed)

## 2016-06-20 NOTE — Progress Notes (Signed)
Patient ID: Alfred Berry, male   DOB: [birth year], 50 y.o.   MRN: [id number] Patient presents for orthotic pick up.  Verbal and written break in and wear instructions given.  Patient will follow up in 4 weeks if symptoms worsen or fail to improve.

## 2016-07-12 ENCOUNTER — Other Ambulatory Visit: Payer: Self-pay | Admitting: Internal Medicine

## 2016-07-13 ENCOUNTER — Other Ambulatory Visit: Payer: BC Managed Care – PPO | Admitting: Internal Medicine

## 2016-07-13 DIAGNOSIS — Z0000 Encounter for general adult medical examination without abnormal findings: Secondary | ICD-10-CM

## 2016-07-13 DIAGNOSIS — E785 Hyperlipidemia, unspecified: Secondary | ICD-10-CM

## 2016-07-13 LAB — COMPLETE METABOLIC PANEL WITH GFR
ALT: 23 U/L (ref 9–46)
AST: 19 U/L (ref 10–35)
Albumin: 4.5 g/dL (ref 3.6–5.1)
Alkaline Phosphatase: 29 U/L — ABNORMAL LOW (ref 40–115)
BUN: 15 mg/dL (ref 7–25)
CALCIUM: 9.3 mg/dL (ref 8.6–10.3)
CHLORIDE: 103 mmol/L (ref 98–110)
CO2: 25 mmol/L (ref 20–31)
Creat: 1.08 mg/dL (ref 0.70–1.33)
GFR, EST NON AFRICAN AMERICAN: 80 mL/min (ref >=60)
GFR, Est African American: >89 mL/min (ref >=60)
Glucose, Bld: 94 mg/dL (ref 65–99)
POTASSIUM: 4.6 mmol/L (ref 3.5–5.3)
Sodium: 140 mmol/L (ref 135–146)
Total Bilirubin: 0.8 mg/dL (ref 0.2–1.2)
Total Protein: 7.1 g/dL (ref 6.1–8.1)

## 2016-07-13 LAB — CBC WITH DIFFERENTIAL/PLATELET
Basophils Absolute: 39 cells/uL (ref 0–200)
Basophils Relative: 1 %
EOS PCT: 2 %
Eosinophils Absolute: 78 cells/uL (ref 15–500)
HEMATOCRIT: 44.7 % (ref 38.5–50.0)
Hemoglobin: 14.9 g/dL (ref 13.2–17.1)
LYMPHS PCT: 40 %
Lymphs Abs: 1560 cells/uL (ref 850–3900)
MCH: 32 pg (ref 27.0–33.0)
MCHC: 33.3 g/dL (ref 32.0–36.0)
MCV: 95.9 fL (ref 80.0–100.0)
MONO ABS: 312 {cells}/uL (ref 200–950)
MPV: 10.1 fL (ref 7.5–12.5)
Monocytes Relative: 8 %
NEUTROS PCT: 49 %
Neutro Abs: 1911 cells/uL (ref 1500–7800)
Platelets: 231 10*3/uL (ref 140–400)
RBC: 4.66 MIL/uL (ref 4.20–5.80)
RDW: 13.5 % (ref 11.0–15.0)
WBC: 3.9 10*3/uL (ref 3.8–10.8)

## 2016-07-13 LAB — LIPID PANEL
Cholesterol: 179 mg/dL (ref 125–200)
HDL: 57 mg/dL (ref >=40)
LDL CALC: 106 mg/dL (ref <130)
Total CHOL/HDL Ratio: 3.1 Ratio (ref <=5.0)
Triglycerides: 78 mg/dL (ref <150)
VLDL: 16 mg/dL (ref <30)

## 2016-07-14 LAB — PSA: PSA: 4.03 ng/mL — AB (ref <=4.00)

## 2016-07-18 ENCOUNTER — Encounter: Payer: Self-pay | Admitting: Internal Medicine

## 2016-07-18 ENCOUNTER — Ambulatory Visit (INDEPENDENT_AMBULATORY_CARE_PROVIDER_SITE_OTHER): Payer: BC Managed Care – PPO | Admitting: Internal Medicine

## 2016-07-18 VITALS — BP 130/80 | HR 70 | Temp 97.8°F | Ht 68.5 in | Wt 192.0 lb

## 2016-07-18 DIAGNOSIS — R972 Elevated prostate specific antigen [PSA]: Secondary | ICD-10-CM | POA: Diagnosis not present

## 2016-07-18 DIAGNOSIS — M722 Plantar fascial fibromatosis: Secondary | ICD-10-CM | POA: Diagnosis not present

## 2016-07-18 DIAGNOSIS — E669 Obesity, unspecified: Secondary | ICD-10-CM

## 2016-07-18 DIAGNOSIS — Z0000 Encounter for general adult medical examination without abnormal findings: Secondary | ICD-10-CM | POA: Diagnosis not present

## 2016-07-18 DIAGNOSIS — J309 Allergic rhinitis, unspecified: Secondary | ICD-10-CM | POA: Diagnosis not present

## 2016-07-18 DIAGNOSIS — N529 Male erectile dysfunction, unspecified: Secondary | ICD-10-CM

## 2016-07-18 DIAGNOSIS — F411 Generalized anxiety disorder: Secondary | ICD-10-CM | POA: Diagnosis not present

## 2016-07-18 DIAGNOSIS — E785 Hyperlipidemia, unspecified: Secondary | ICD-10-CM

## 2016-07-18 LAB — POCT URINALYSIS DIPSTICK
Bilirubin, UA: NEGATIVE
GLUCOSE UA: NEGATIVE
Ketones, UA: NEGATIVE
LEUKOCYTES UA: NEGATIVE
NITRITE UA: NEGATIVE
PH UA: 5
Protein, UA: NEGATIVE
RBC UA: NEGATIVE
Spec Grav, UA: <=1.005
UROBILINOGEN UA: 0.2

## 2016-07-18 NOTE — Progress Notes (Signed)
Subjective:    Patient ID: Alfred Berry, male    DOB: Jan 12, 1966, 50 y.o.   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  He has a history of hyperlipidemia treated with Zocor. Has been on Zocor since January 2010. History of mild obesity and borderline hypertension. History of allergic rhinitis.  Had cosmetic surgery 1975.  In February 2016, had Herpes zoster affecting left scalp and left eye. This resolved without residual issues and was referred to ophthalmologist.  Social history: Does not smoke. Social alcohol consumption. 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 No children.  Family history: Father with history coronary artery bypass surgery in 2002 and history of hypertension. Patient's father was adopted so not much is known about paternal family history. Mother living in good health.  This is a first year we have screened him for prostate cancer. His PSA is elevated at 4.03. He does have some nocturia but not more than once or twice a night.  History of erectile dysfunction  He has never had screening colonoscopy and we will refer him for that in the near future. No GI complaints.  History of bilateral plantar fasciitis for some 10 months which is slowly improving.    Review of Systems  Constitutional: Negative.   Respiratory: Negative.   Cardiovascular: Negative.   Gastrointestinal: Negative.   Genitourinary:       Nocturia once or twice a night  Neurological: Negative.        Objective:   Physical Exam  Constitutional: He is oriented to person, place, and time. He appears well-developed and well-nourished. No distress.  HENT:  Head: Normocephalic and atraumatic.  Right Ear: External ear normal.  Left Ear: External ear normal.  Mouth/Throat: Oropharynx is clear and moist.  Eyes: Conjunctivae and EOM are normal. Pupils are equal,  round, and reactive to light. Right eye exhibits no discharge. Left eye exhibits no discharge. No scleral icterus.  Neck: Neck supple. No JVD present. No thyromegaly present.  Cardiovascular: Normal rate, regular rhythm, normal heart sounds and intact distal pulses.   No murmur heard. Pulmonary/Chest: Effort normal and breath sounds normal. No respiratory distress. He has no wheezes. He has no rales. He exhibits no tenderness.  Abdominal: Soft. Bowel sounds are normal. He exhibits no distension and no mass. There is no rebound and no guarding.  Genitourinary:  Genitourinary Comments: Prostate seems to be sent back to call without nodules and perhaps slightly enlarged  Musculoskeletal: He exhibits no edema.  Lymphadenopathy:    He has no cervical adenopathy.  Neurological: He is alert and oriented to person, place, and time. He has normal reflexes. No cranial nerve deficit.  Skin: Skin is warm and dry. No rash noted. He is not diaphoretic.  Psychiatric: He has a normal mood and affect. His behavior is normal. Judgment and thought content normal.  Vitals reviewed.         Assessment & Plan:  Normal health maintenance exam  Elevated PSA-do not see previous PSAs in his chart. PSA is 4.03 and he basically is asymptomatic except for nocturia once or twice nightly.  History of erectile dysfunction  History of mild anxiety  History of borderline hypertension  Hyperlipidemia  History of allergic rhinitis  Mild obesity  Bilateral plantar fasciitis-improving  Plan: Arrange for screening colonoscopy. Refer to urology for evaluation of elevated PSA. Continue statin  medication for hyperlipidemia. Lipid panel liver functions are normal.  Continue to monitor blood pressure at home on a regular basis and call if persistently elevated.  Return in one year or as needed.  Need for screening colonoscopy

## 2016-07-18 NOTE — Patient Instructions (Addendum)
It was a pleasure to see today. Keep an eye on your blood pressure at home. We will make Urology referral for elevated PSA. We will make referral for screening colonoscopy. Return in one year or as needed.

## 2016-07-19 ENCOUNTER — Encounter: Payer: Self-pay | Admitting: Internal Medicine

## 2016-07-20 ENCOUNTER — Other Ambulatory Visit: Payer: Self-pay

## 2016-07-20 DIAGNOSIS — R972 Elevated prostate specific antigen [PSA]: Secondary | ICD-10-CM

## 2016-07-20 DIAGNOSIS — Z0000 Encounter for general adult medical examination without abnormal findings: Secondary | ICD-10-CM

## 2016-07-24 ENCOUNTER — Telehealth: Payer: Self-pay

## 2016-07-24 NOTE — Telephone Encounter (Signed)
Called patient to inform appt w/ [facility] scheduled for Friday, Sept. 29th @ 1:00pm. [facility] will send paperwork in the mail prior to appt.  No answer. Will try later.

## 2016-07-28 NOTE — Telephone Encounter (Signed)
Spoke to patient. Gave referral info.

## 2016-07-28 NOTE — Telephone Encounter (Signed)
Called patient.  No answer.

## 2016-09-07 ENCOUNTER — Other Ambulatory Visit: Payer: Self-pay | Admitting: Internal Medicine

## 2016-09-07 NOTE — Telephone Encounter (Signed)
Call in Xanax refill .

## 2016-09-08 NOTE — Telephone Encounter (Signed)
done

## 2016-09-15 ENCOUNTER — Ambulatory Visit (INDEPENDENT_AMBULATORY_CARE_PROVIDER_SITE_OTHER): Payer: BC Managed Care – PPO | Admitting: Internal Medicine

## 2016-09-15 ENCOUNTER — Ambulatory Visit (AMBULATORY_SURGERY_CENTER): Payer: Self-pay | Admitting: *Deleted

## 2016-09-15 VITALS — Ht 69.0 in | Wt 187.0 lb

## 2016-09-15 DIAGNOSIS — Z23 Encounter for immunization: Secondary | ICD-10-CM | POA: Diagnosis not present

## 2016-09-15 DIAGNOSIS — Z1211 Encounter for screening for malignant neoplasm of colon: Secondary | ICD-10-CM

## 2016-09-15 MED ORDER — NA SULFATE-K SULFATE-MG SULF 17.5-3.13-1.6 GM/177ML PO SOLN: ORAL | 0 refills | Status: DC

## 2016-09-15 NOTE — Progress Notes (Signed)
Patient denies any allergies to eggs or soy. Patient denies any problems with anesthesia/sedation. Patient denies any oxygen use at home and does not take any diet/weight loss medications. EMMI education assisgned to patient on colonoscopy, this was explained and instructions given to patient. 

## 2016-09-22 ENCOUNTER — Encounter: Payer: Self-pay | Admitting: Internal Medicine

## 2016-09-29 ENCOUNTER — Encounter: Payer: BC Managed Care – PPO | Admitting: Internal Medicine

## 2016-10-06 ENCOUNTER — Encounter: Payer: Self-pay | Admitting: Internal Medicine

## 2016-10-06 ENCOUNTER — Ambulatory Visit (AMBULATORY_SURGERY_CENTER): Payer: BC Managed Care – PPO | Admitting: Internal Medicine

## 2016-10-06 VITALS — BP 104/73 | HR 58 | Temp 96.6°F | Resp 15 | Ht 69.0 in | Wt 187.0 lb

## 2016-10-06 DIAGNOSIS — D122 Benign neoplasm of ascending colon: Secondary | ICD-10-CM | POA: Diagnosis not present

## 2016-10-06 DIAGNOSIS — K635 Polyp of colon: Secondary | ICD-10-CM | POA: Diagnosis not present

## 2016-10-06 DIAGNOSIS — Z1212 Encounter for screening for malignant neoplasm of rectum: Secondary | ICD-10-CM | POA: Diagnosis not present

## 2016-10-06 DIAGNOSIS — D125 Benign neoplasm of sigmoid colon: Secondary | ICD-10-CM

## 2016-10-06 DIAGNOSIS — Z1211 Encounter for screening for malignant neoplasm of colon: Secondary | ICD-10-CM

## 2016-10-06 MED ORDER — SODIUM CHLORIDE 0.9 % IV SOLN: 500.0000 mL | INTRAVENOUS | Status: DC

## 2016-10-06 NOTE — Op Note (Signed)
[facility] Patient Name: Alfred Berry Procedure Date: 10/06/2016 1:17 PM MRN: [id number] Endoscopist: Docia Chuck. Henrene Pastor , MD Age: 50 Referring MD:  Date of Birth: Nov 06, 1966 Gender: Male Account #: [id number] Procedure:                Colonoscopy , with cold snare polypectomy x 2 Indications:              Screening for colorectal malignant neoplasm Medicines:                Monitored Anesthesia Care Procedure:                Pre-Anesthesia Assessment:                           - Prior to the procedure, a History and Physical                            was performed, and patient medications and                            allergies were reviewed. The patient's tolerance of                            previous anesthesia was also reviewed. The risks                            and benefits of the procedure and the sedation                            options and risks were discussed with the patient.                            All questions were answered, and informed consent                            was obtained. Prior Anticoagulants: The patient has                            taken no previous anticoagulant or antiplatelet                            agents. ASA Grade Assessment: II - A patient with                            mild systemic disease. After reviewing the risks                            and benefits, the patient was deemed in                            satisfactory condition to undergo the procedure.                           After obtaining informed consent, the colonoscope  was passed under direct vision. Throughout the                            procedure, the patient's blood pressure, pulse, and                            oxygen saturations were monitored continuously. The                            Model CF-HQ190L [id number]) scope was introduced                            through the anus and advanced to the the cecum,              identified by appendiceal orifice and ileocecal                            valve. The ileocecal valve, appendiceal orifice,                            and rectum were photographed. The quality of the                            bowel preparation was good. The colonoscopy was                            performed without difficulty. The patient tolerated                            the procedure well. The bowel preparation used was                            SUPREP. Scope In: 1:27:39 PM Scope Out: 1:46:56 PM Scope Withdrawal Time: 0 hours 15 minutes 49 seconds  Total Procedure Duration: 0 hours 19 minutes 17 seconds  Findings:                 Two sessile polyps were found in the sigmoid colon                            and ascending colon. The polyps were 4 to 6 mm in                            size. These polyps were removed with a cold snare.                            Resection and retrieval were complete.                           The exam was otherwise without abnormality on                            direct and retroflexion views. Complications:            No immediate complications. Estimated  blood loss:                            None. Estimated Blood Loss:     Estimated blood loss: none. Impression:               - Two 4 to 6 mm polyps in the sigmoid colon and in                            the ascending colon, removed with a cold snare.                            Resected and retrieved.                           - The examination was otherwise normal on direct                            and retroflexion views. Recommendation:           - Repeat colonoscopy in 5 years for surveillance.                           - Patient has a contact number available for                            emergencies. The signs and symptoms of potential                            delayed complications were discussed with the                            patient. Return to normal activities  tomorrow.                            Written discharge instructions were provided to the                            patient.                           - Resume previous diet.                           - Continue present medications.                           - Await pathology results. Docia Chuck. Henrene Pastor, MD 10/06/2016 1:55:00 PM This report has been signed electronically.

## 2016-10-06 NOTE — Progress Notes (Signed)
A and O x3. Report to RN. Tolerated MAC anesthesia well. 

## 2016-10-06 NOTE — Patient Instructions (Signed)
Impression/Recommendations:  Polyp handout given to patient.  Repeat colonoscopy in 5 years for surveillance.  YOU HAD AN ENDOSCOPIC PROCEDURE TODAY AT [facility]:   Refer to the procedure report that was given to you for any specific questions about what was found during the examination.  If the procedure report does not answer your questions, please call your gastroenterologist to clarify.  If you requested that your care partner not be given the details of your procedure findings, then the procedure report has been included in a sealed envelope for you to review at your convenience later.  YOU SHOULD EXPECT: Some feelings of bloating in the abdomen. Passage of more gas than usual.  Walking can help get rid of the air that was put into your GI tract during the procedure and reduce the bloating. If you had a lower endoscopy (such as a colonoscopy or flexible sigmoidoscopy) you may notice spotting of blood in your stool or on the toilet paper. If you underwent a bowel prep for your procedure, you may not have a normal bowel movement for a few days.  Please Note:  You might notice some irritation and congestion in your nose or some drainage.  This is from the oxygen used during your procedure.  There is no need for concern and it should clear up in a day or so.  SYMPTOMS TO REPORT IMMEDIATELY:   Following lower endoscopy (colonoscopy or flexible sigmoidoscopy):  Excessive amounts of blood in the stool  Significant tenderness or worsening of abdominal pains  Swelling of the abdomen that is new, acute  Fever of 100F or higher  For urgent or emergent issues, a gastroenterologist can be reached at any hour by calling [phone].   DIET:  We do recommend a small meal at first, but then you may proceed to your regular diet.  Drink plenty of fluids but you should avoid alcoholic beverages for 24 hours.  ACTIVITY:  You should plan to take it easy for the rest of today and you  should NOT DRIVE or use heavy machinery until tomorrow (because of the sedation medicines used during the test).    FOLLOW UP: Our staff will call the number listed on your records the next business day following your procedure to check on you and address any questions or concerns that you may have regarding the information given to you following your procedure. If we do not reach you, we will leave a message.  However, if you are feeling well and you are not experiencing any problems, there is no need to return our call.  We will assume that you have returned to your regular daily activities without incident.  If any biopsies were taken you will be contacted by phone or by letter within the next 1-3 weeks.  Please call us at [phone] if you have not heard about the biopsies in 3 weeks.    SIGNATURES/CONFIDENTIALITY: You and/or your care partner have signed paperwork which will be entered into your electronic medical record.  These signatures attest to the fact that that the information above on your After Visit Summary has been reviewed and is understood.  Full responsibility of the confidentiality of this discharge information lies with you and/or your care-partner.

## 2016-10-06 NOTE — Progress Notes (Signed)
Called to room to assist during endoscopic procedure.  Patient ID and intended procedure confirmed with present staff. Received instructions for my participation in the procedure from the performing physician.  

## 2016-10-09 ENCOUNTER — Telehealth: Payer: Self-pay

## 2016-10-09 NOTE — Telephone Encounter (Signed)
  Follow up Call-  Call back number 10/06/2016  Post procedure Call Back phone  # [phone]  Permission to leave phone message Yes  Some recent data might be hidden     Patient questions:  Do you have a fever, pain , or abdominal swelling? No. Pain Score  0 *  Have you tolerated food without any problems? Yes.    Have you been able to return to your normal activities? Yes.    Do you have any questions about your discharge instructions: Diet   No. Medications  No. Follow up visit  No.  Do you have questions or concerns about your Care? No.  Actions: * If pain score is 4 or above: No action needed, pain <4.

## 2016-10-09 NOTE — Telephone Encounter (Signed)
  Follow up Call-  Call back number 10/06/2016  Post procedure Call Back phone  # [phone]  Permission to leave phone message Yes  Some recent data might be hidden    Patient was called for follow up after his procedure on 10/06/2016. No answer at the number given for follow up phone call. A message was left on the answering machine.

## 2016-10-11 ENCOUNTER — Encounter: Payer: Self-pay | Admitting: Internal Medicine

## 2016-10-11 ENCOUNTER — Telehealth: Payer: Self-pay | Admitting: Internal Medicine

## 2016-10-11 MED ORDER — MOMETASONE FUROATE 50 MCG/ACT NA SUSP: NASAL | 99 refills | Status: DC

## 2016-10-11 NOTE — Telephone Encounter (Signed)
Refill Nasonex nasal spray prn one year

## 2017-03-16 ENCOUNTER — Telehealth: Payer: Self-pay | Admitting: *Deleted

## 2017-03-16 NOTE — Telephone Encounter (Signed)
Pt states received orthotics 9 months ago, and he has had some improvement but he is not totally well. I left message informing pt he would benefit for an appt to discuss what his orthotics have improved and what would improve the orthotics for him and that we had a pedorothist that may be able to modify his orthotics in the office.

## 2017-04-02 ENCOUNTER — Encounter: Payer: Self-pay | Admitting: Internal Medicine

## 2017-04-02 ENCOUNTER — Ambulatory Visit (INDEPENDENT_AMBULATORY_CARE_PROVIDER_SITE_OTHER): Payer: BC Managed Care – PPO | Admitting: Internal Medicine

## 2017-04-02 VITALS — BP 138/98 | HR 63 | Temp 97.3°F | Wt 189.0 lb

## 2017-04-02 DIAGNOSIS — M5410 Radiculopathy, site unspecified: Secondary | ICD-10-CM | POA: Diagnosis not present

## 2017-04-02 MED ORDER — HYDROCODONE-ACETAMINOPHEN 10-325 MG PO TABS: 1.0000 | ORAL_TABLET | ORAL | 0 refills | Status: DC | PRN

## 2017-04-02 MED ORDER — CYCLOBENZAPRINE HCL 10 MG PO TABS: 10.0000 mg | ORAL_TABLET | Freq: Three times a day (TID) | ORAL | 3 refills | Status: DC | PRN

## 2017-04-02 MED ORDER — PREDNISONE 10 MG PO TABS
ORAL_TABLET | ORAL | 0 refills | Status: DC
Start: 2017-04-02 — End: 2017-10-12

## 2017-04-02 NOTE — Patient Instructions (Addendum)
Sterapred DS 10 mg 12 day dosepak. Flexeril 10 mg at least at bedtime and up to 3 times a day. Norco 10/325 one half to one by mouth every 6 hours when necessary pain. Take with food. Recommend MRI of the LS-spine.

## 2017-04-02 NOTE — Progress Notes (Signed)
   Subjective:    Patient ID: Alfred Berry, male    DOB: 18-Jan-1966, 51 y.o.   MRN: [id number]  HPI 51 year old male with on set of acute back pain about a week ago. It has not gotten better. In fact it has gotten worse. 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 He tried doing some light workout at the gym last week. He sat down and a soft chair and when he tried to get out of chair he had very severe back pain. Pain seems to be worse on the right than the left but goes across his entire back. He is barely able to walk. He is using a cane to walk. He is moving very slowly.  Patient says he injured his back in high school in gym class. Has never had an MRI to see what the anatomy looks like in his back. From time to time would have episodes of back pain but this is the most severe episode he has ever had.  He feels constipated. No urinary symptoms.    Review of Systems see above. Has had some tingling in his legs.     Objective:   Physical Exam Skin is warm and dry. He looks to be in acute pain. He is ambulating slowly. Using cane to ambulate. Difficulty getting up onto exam table due to pain. Straight leg raising is positive at 90 on the right. Deep tendon reflexes 1+ and symmetrical. Muscle strength appears to be normal in both lower extremities.       Assessment & Plan:  Acute low back pain  ? Herniated disc on the right L4-L5  Plan: Try to get MRI of lumbar spine approved. Flexeril 10 mg at bedtime and up to 3 times a day. Sterapred DS 10 mg 12 day dosepak take as directed in tapering course. Norco 10/325 one half to one by mouth every 6-8 hours when necessary pain with no refill #30.  25 minutes spent with patient and wife and evaluation and treatment of back pain

## 2017-04-03 ENCOUNTER — Ambulatory Visit: Payer: BC Managed Care – PPO | Admitting: Podiatry

## 2017-04-06 ENCOUNTER — Telehealth: Payer: Self-pay | Admitting: Internal Medicine

## 2017-04-06 ENCOUNTER — Other Ambulatory Visit: Payer: Self-pay | Admitting: Internal Medicine

## 2017-04-06 NOTE — Telephone Encounter (Signed)
Please refill for 6 months 

## 2017-04-06 NOTE — Telephone Encounter (Signed)
Would defer biopsy until back is better and off Prednisone.

## 2017-04-06 NOTE — Telephone Encounter (Addendum)
Spoke with patient and advised to defer biopsy until back is healed and better.  Patient feels good about this decision.  Patient further advised that he is planning to go forward with the MRI on Friday.  He is then traveling out of the country for 6 weeks.  Patient will call and reschedule the biopsy for another time after he finishes the Prednisone treatment.

## 2017-04-06 NOTE — Telephone Encounter (Signed)
Patient calling regarding his Urology Biopsy scheduled on 5/3.  They advised that he would have decrease his Prednisone to 5mg  3 days prior to the biopsy and decrease it to 5mg  3 days after the biopsy as well.  Patient states that he's not sure that he's willing to go through with the biopsy at this particular time versus continuing the prednisone therapy and rescheduling the biopsy for a later date.  He feels that perhaps the prednisone therapy and healing his back may be the better option for him since they are leaving the country on May 17th for 6 weeks.    He's afraid that if he just takes 5 mg of Prednisone during the 3 days prior to biopsy and after biopsy that his back will suffer the consequences and that he will not heal completely prior to leaving the country on 5/17.    Patient just wanted to get your input.  Do you feel one is more valuable than the other?    Patient can be reached at [phone].

## 2017-04-10 ENCOUNTER — Ambulatory Visit (INDEPENDENT_AMBULATORY_CARE_PROVIDER_SITE_OTHER): Payer: BC Managed Care – PPO | Admitting: Podiatry

## 2017-04-10 ENCOUNTER — Encounter: Payer: Self-pay | Admitting: Podiatry

## 2017-04-10 DIAGNOSIS — M722 Plantar fascial fibromatosis: Secondary | ICD-10-CM | POA: Diagnosis not present

## 2017-04-10 NOTE — Progress Notes (Signed)
She presents today requesting new orthotics he states that he still has some pain.  Objective: Vital signs are stable alert and oriented 3 he has no pain on palpation medial calcaneal tubercles bilateral. Pulses remain palpable.  Assessment: Plantar fasciitis resolving.  Plan: He scan for new set of orthotics.

## 2017-04-13 ENCOUNTER — Ambulatory Visit
Admission: RE | Admit: 2017-04-13 | Discharge: 2017-04-13 | Disposition: A | Discharge status: Home / Self Care | Payer: BC Managed Care – PPO | Source: Ambulatory Visit | Attending: Internal Medicine | Admitting: Internal Medicine

## 2017-04-17 ENCOUNTER — Telehealth: Payer: Self-pay | Admitting: Internal Medicine

## 2017-04-17 NOTE — Telephone Encounter (Signed)
Pt to try PT and TENS unit. Rx provided.

## 2017-04-17 NOTE — Telephone Encounter (Addendum)
Patient called back; states that he's really beginning to feel much better.  Wants to know if you'll write him a prescription for physical therapy.  Also wants to know if you'll write a prescription for a TENS unit for him (a "friend" has one and he's told him how wonderful they are).  Also spoke with the patient about doing epidural injections.    [facility] COULD take him tomorrow at 9:00 a.m. Or Thursday at 8:00 or 8:30 (Dr. Margarita Rana is there both days).  They state that he would need 1 day of rest and they recommend that he get the injection 2 weeks before he goes.  However, it's only required that he rest for 1 day.  And, the injections last for 4 weeks total.  So, the injection would really be great.    Patient is hesitant on the injection because of the "term" injection and because he's feeling better.  He would appreciate your input on the injection part of therapy versus physical therapy versus prednisone therapy taking this on the trip and Flexiril.  He is going to be on an 8 hour plane trip, doing a lot of walking on this trip, doing a lot of driving on this trip.  So, he wants to make sure that he's doing what is the best treatment plan for him for this trip.  He doesn't want to make the wrong decision just because he's feeling better in the short term for the long term benefit.    Would you please give him a call to discuss this with him.  And, he would like to come by and pick up a Rx for physical therapy.    Thank you.     Pt wants refill on Prednisone also.

## 2017-04-23 ENCOUNTER — Ambulatory Visit: Payer: BC Managed Care – PPO | Admitting: Internal Medicine

## 2017-04-26 ENCOUNTER — Telehealth: Payer: Self-pay | Admitting: Podiatry

## 2017-04-26 NOTE — Telephone Encounter (Signed)
Called pt to let him know that orthotics are in.

## 2017-08-05 ENCOUNTER — Other Ambulatory Visit: Payer: Self-pay | Admitting: Internal Medicine

## 2017-10-08 ENCOUNTER — Other Ambulatory Visit: Payer: BC Managed Care – PPO | Admitting: Internal Medicine

## 2017-10-08 DIAGNOSIS — J309 Allergic rhinitis, unspecified: Secondary | ICD-10-CM

## 2017-10-08 DIAGNOSIS — E669 Obesity, unspecified: Secondary | ICD-10-CM

## 2017-10-08 DIAGNOSIS — E785 Hyperlipidemia, unspecified: Secondary | ICD-10-CM

## 2017-10-08 DIAGNOSIS — Z13220 Encounter for screening for lipoid disorders: Secondary | ICD-10-CM

## 2017-10-08 DIAGNOSIS — Z0000 Encounter for general adult medical examination without abnormal findings: Secondary | ICD-10-CM

## 2017-10-10 LAB — COMPLETE METABOLIC PANEL WITH GFR
AG RATIO: 1.8 (calc) (ref 1.0–2.5)
ALKALINE PHOSPHATASE (APISO): 29 U/L — AB (ref 40–115)
ALT: 17 U/L (ref 9–46)
AST: 18 U/L (ref 10–35)
Albumin: 4.2 g/dL (ref 3.6–5.1)
BILIRUBIN TOTAL: 0.6 mg/dL (ref 0.2–1.2)
BUN: 16 mg/dL (ref 7–25)
CHLORIDE: 106 mmol/L (ref 98–110)
CO2: 28 mmol/L (ref 20–32)
Calcium: 9.1 mg/dL (ref 8.6–10.3)
Creat: 1.14 mg/dL (ref 0.70–1.33)
GFR, EST NON AFRICAN AMERICAN: 74 mL/min/{1.73_m2} (ref >=60)
GFR, Est African American: 86 mL/min/{1.73_m2} (ref >=60)
GLOBULIN: 2.4 g/dL (ref 1.9–3.7)
Glucose, Bld: 106 mg/dL — ABNORMAL HIGH (ref 65–99)
POTASSIUM: 4.1 mmol/L (ref 3.5–5.3)
SODIUM: 141 mmol/L (ref 135–146)
Total Protein: 6.6 g/dL (ref 6.1–8.1)

## 2017-10-10 LAB — CBC WITH DIFFERENTIAL/PLATELET
BASOS ABS: 21 {cells}/uL (ref 0–200)
Basophils Relative: 0.3 %
Eosinophils Absolute: 128 cells/uL (ref 15–500)
Eosinophils Relative: 1.8 %
HEMATOCRIT: 43.6 % (ref 38.5–50.0)
Hemoglobin: 14.7 g/dL (ref 13.2–17.1)
LYMPHS ABS: 1399 {cells}/uL (ref 850–3900)
MCH: 31.2 pg (ref 27.0–33.0)
MCHC: 33.7 g/dL (ref 32.0–36.0)
MCV: 92.6 fL (ref 80.0–100.0)
MPV: 10.7 fL (ref 7.5–12.5)
Monocytes Relative: 5.6 %
NEUTROS PCT: 72.6 %
Neutro Abs: 5155 cells/uL (ref 1500–7800)
PLATELETS: 242 10*3/uL (ref 140–400)
RBC: 4.71 10*6/uL (ref 4.20–5.80)
RDW: 12.2 % (ref 11.0–15.0)
TOTAL LYMPHOCYTE: 19.7 %
WBC: 7.1 10*3/uL (ref 3.8–10.8)
WBCMIX: 398 {cells}/uL (ref 200–950)

## 2017-10-10 LAB — LIPID PANEL
CHOLESTEROL: 147 mg/dL (ref <200)
HDL: 52 mg/dL (ref >40)
LDL Cholesterol (Calc): 79 mg/dL (calc)
NON-HDL CHOLESTEROL (CALC): 95 mg/dL (ref <130)
Total CHOL/HDL Ratio: 2.8 (calc) (ref <5.0)
Triglycerides: 78 mg/dL (ref <150)

## 2017-10-10 LAB — TEST AUTHORIZATION

## 2017-10-10 LAB — PSA: PSA: 2 ng/mL (ref <=4.0)

## 2017-10-10 LAB — HEMOGLOBIN A1C W/OUT EAG: Hgb A1c MFr Bld: 5.4 % of total Hgb (ref <5.7)

## 2017-10-12 ENCOUNTER — Ambulatory Visit (INDEPENDENT_AMBULATORY_CARE_PROVIDER_SITE_OTHER): Payer: BC Managed Care – PPO | Admitting: Internal Medicine

## 2017-10-12 ENCOUNTER — Encounter: Payer: Self-pay | Admitting: Internal Medicine

## 2017-10-12 VITALS — BP 102/78 | HR 71 | Temp 97.6°F | Ht 67.0 in | Wt 181.0 lb

## 2017-10-12 DIAGNOSIS — E785 Hyperlipidemia, unspecified: Secondary | ICD-10-CM | POA: Diagnosis not present

## 2017-10-12 DIAGNOSIS — N529 Male erectile dysfunction, unspecified: Secondary | ICD-10-CM

## 2017-10-12 DIAGNOSIS — N400 Enlarged prostate without lower urinary tract symptoms: Secondary | ICD-10-CM

## 2017-10-12 DIAGNOSIS — M5410 Radiculopathy, site unspecified: Secondary | ICD-10-CM | POA: Diagnosis not present

## 2017-10-12 DIAGNOSIS — M722 Plantar fascial fibromatosis: Secondary | ICD-10-CM | POA: Diagnosis not present

## 2017-10-12 DIAGNOSIS — R972 Elevated prostate specific antigen [PSA]: Secondary | ICD-10-CM

## 2017-10-12 DIAGNOSIS — J069 Acute upper respiratory infection, unspecified: Secondary | ICD-10-CM

## 2017-10-12 DIAGNOSIS — Z0000 Encounter for general adult medical examination without abnormal findings: Secondary | ICD-10-CM | POA: Diagnosis not present

## 2017-10-12 DIAGNOSIS — J309 Allergic rhinitis, unspecified: Secondary | ICD-10-CM | POA: Diagnosis not present

## 2017-10-12 LAB — POCT URINALYSIS DIPSTICK
BILIRUBIN UA: NEGATIVE
Blood, UA: NEGATIVE
Glucose, UA: NEGATIVE
KETONES UA: NEGATIVE
LEUKOCYTES UA: NEGATIVE
NITRITE UA: NEGATIVE
PH UA: 7.5 (ref 5.0–8.0)
PROTEIN UA: NEGATIVE
Spec Grav, UA: 1.025 (ref 1.010–1.025)
Urobilinogen, UA: 0.2 E.U./dL

## 2017-10-12 MED ORDER — AZITHROMYCIN 250 MG PO TABS: ORAL_TABLET | ORAL | 0 refills | Status: DC

## 2017-10-12 NOTE — Progress Notes (Signed)
   Subjective:    Patient ID: Alfred Berry, male    DOB: 1966-11-26, 51 y.o.   MRN: [id number]  51 year old Male in today for health maintenance exam and evaluation of medical issues.  History of elevated PSA 2017 of 4.03 requiring prostate biopsy which proved to be benign.  History of plantar fasciitis for which he has seen podiatrist.   In April 2018 he had severe right-sided back pain with radiculopathy that subsequently resolved after physical therapy and medical treatment with steroids and muscle relaxants.  He gets annual flu vaccine.  Colonoscopy October 2017.  2 polyps removed.  1 of which was a sessile adenoma.  Repeat study recommended in 5 years.  In February 2016 had herpes zoster affecting left scalp and left eye.  This resolved without residual issues.  He did see ophthalmologist.  Had cosmetic surgery in 1975.  History of hyperlipidemia treated with Zocor since January 2010.  History of mild obesity and borderline hypertension.  History of allergic rhinitis.  History of mild anxiety treated with Xanax with trip to [place].  History of nocturia but no more than once or twice a night.  History of erectile dysfunction.  Family history: Father with history of coronary artery bypass surgery in 2002 with history of hypertension.  Patient's father was adopted so not much is known about paternal family history.  Mother living in good health.  Social history: Does not smoke.  Social alcohol consumption.  She is a Health and safety inspector at [employer].  Wife is an art history professor who also teaches at [employer].  No children.    Review of Systems has URI symptoms with cough and congestion, malaise and fatigue.  Urologist has him on Proscar 5 mg daily for BPH.  He also prescribed Viagra for him.     Objective:   Physical Exam        Assessment & Plan:  BMI 28.35 continue diet and exercise.  Now working out at gym at [employer]  BPH with elevated PSA treated with Proscar.  Recent PSA  is 2.0 and a year ago was 4.03.  Erectile dysfunction  Allergic rhinitis  Acute URI treated with Zithromax Z-Pak  History of lumbar back pain with radiculopathy  History of plantar fasciitis  History of anxiety but mainly with traveling to [place] treated sparingly with Xanax.  Hyperlipidemia on statin therapy and lipid panel is normal as a homere liver functions.  Plan: Fasting glucose is 106.  Hemoglobin A1c drawn and is within normal limits at 5.4%.  Return in 1 year or as needed.  Take Zithromax Z-Pak for respiratory infection.

## 2017-11-02 ENCOUNTER — Encounter: Payer: Self-pay | Admitting: Internal Medicine

## 2017-11-02 NOTE — Patient Instructions (Signed)
It was a pleasure to see you today.  Take Zithromax Z-Pak as directed for acute URI.  Continue statin medication.  Continue medications per urologist.  Return in 1 year or as needed.

## 2017-11-12 ENCOUNTER — Other Ambulatory Visit: Payer: Self-pay | Admitting: Internal Medicine

## 2017-12-13 ENCOUNTER — Other Ambulatory Visit: Payer: Self-pay | Admitting: Internal Medicine

## 2017-12-13 NOTE — Telephone Encounter (Signed)
Refill #60 with one refill

## 2017-12-13 NOTE — Telephone Encounter (Signed)
Called in to [facility] [place] [place]

## 2018-04-21 ENCOUNTER — Other Ambulatory Visit: Payer: Self-pay | Admitting: Internal Medicine

## 2018-09-01 ENCOUNTER — Other Ambulatory Visit: Payer: Self-pay | Admitting: Internal Medicine

## 2018-10-18 ENCOUNTER — Ambulatory Visit (INDEPENDENT_AMBULATORY_CARE_PROVIDER_SITE_OTHER): Payer: BC Managed Care – PPO | Admitting: Internal Medicine

## 2018-10-18 DIAGNOSIS — Z23 Encounter for immunization: Secondary | ICD-10-CM

## 2018-10-18 NOTE — Patient Instructions (Addendum)
Patient received a flu vaccine IM R deltoid, AV, CMA  

## 2018-12-02 ENCOUNTER — Encounter (HOSPITAL_COMMUNITY): Payer: Self-pay | Admitting: Emergency Medicine

## 2018-12-02 ENCOUNTER — Emergency Department (HOSPITAL_COMMUNITY)
Admission: EM | Admit: 2018-12-02 | Discharge: 2018-12-02 | Disposition: A | Discharge status: Home / Self Care | Payer: BC Managed Care – PPO | Attending: Emergency Medicine | Admitting: Emergency Medicine

## 2018-12-02 ENCOUNTER — Other Ambulatory Visit: Payer: Self-pay

## 2018-12-02 ENCOUNTER — Emergency Department (HOSPITAL_COMMUNITY): Payer: BC Managed Care – PPO

## 2018-12-02 DIAGNOSIS — Y999 Unspecified external cause status: Secondary | ICD-10-CM | POA: Diagnosis not present

## 2018-12-02 DIAGNOSIS — I10 Essential (primary) hypertension: Secondary | ICD-10-CM | POA: Diagnosis not present

## 2018-12-02 DIAGNOSIS — S2242XA Multiple fractures of ribs, left side, initial encounter for closed fracture: Secondary | ICD-10-CM | POA: Diagnosis not present

## 2018-12-02 DIAGNOSIS — Z23 Encounter for immunization: Secondary | ICD-10-CM | POA: Insufficient documentation

## 2018-12-02 DIAGNOSIS — Y9389 Activity, other specified: Secondary | ICD-10-CM | POA: Diagnosis not present

## 2018-12-02 DIAGNOSIS — Y929 Unspecified place or not applicable: Secondary | ICD-10-CM | POA: Insufficient documentation

## 2018-12-02 DIAGNOSIS — S20312A Abrasion of left front wall of thorax, initial encounter: Secondary | ICD-10-CM | POA: Insufficient documentation

## 2018-12-02 DIAGNOSIS — S29001A Unspecified injury of muscle and tendon of front wall of thorax, initial encounter: Secondary | ICD-10-CM | POA: Diagnosis present

## 2018-12-02 DIAGNOSIS — Z79899 Other long term (current) drug therapy: Secondary | ICD-10-CM | POA: Diagnosis not present

## 2018-12-02 DIAGNOSIS — W108XXA Fall (on) (from) other stairs and steps, initial encounter: Secondary | ICD-10-CM | POA: Insufficient documentation

## 2018-12-02 DIAGNOSIS — W19XXXA Unspecified fall, initial encounter: Secondary | ICD-10-CM

## 2018-12-02 DIAGNOSIS — T148XXA Other injury of unspecified body region, initial encounter: Secondary | ICD-10-CM

## 2018-12-02 LAB — URINALYSIS, ROUTINE W REFLEX MICROSCOPIC
BILIRUBIN URINE: NEGATIVE
Glucose, UA: NEGATIVE mg/dL
Hgb urine dipstick: NEGATIVE
Ketones, ur: 5 mg/dL — AB
Leukocytes, UA: NEGATIVE
Nitrite: NEGATIVE
Protein, ur: NEGATIVE mg/dL
SPECIFIC GRAVITY, URINE: 1.025 (ref 1.005–1.030)
pH: 6 (ref 5.0–8.0)

## 2018-12-02 MED ORDER — OXYCODONE HCL 5 MG PO TABS
10.0000 mg | ORAL_TABLET | Freq: Once | ORAL | Status: AC
  Administered 2018-12-02: 10 mg via ORAL
  Filled 2018-12-02: qty 2

## 2018-12-02 MED ORDER — OXYCODONE HCL 5 MG PO TABS: 5.0000 mg | ORAL_TABLET | Freq: Four times a day (QID) | ORAL | 0 refills | Status: DC | PRN

## 2018-12-02 MED ORDER — ACETAMINOPHEN 500 MG PO TABS
1000.0000 mg | ORAL_TABLET | Freq: Once | ORAL | Status: AC
  Administered 2018-12-02: 1000 mg via ORAL
  Filled 2018-12-02: qty 2

## 2018-12-02 MED ORDER — METHOCARBAMOL 500 MG PO TABS: 500.0000 mg | ORAL_TABLET | Freq: Two times a day (BID) | ORAL | 0 refills | Status: DC

## 2018-12-02 MED ORDER — TETANUS-DIPHTH-ACELL PERTUSSIS 5-2.5-18.5 LF-MCG/0.5 IM SUSP
0.5000 mL | Freq: Once | INTRAMUSCULAR | Status: AC
  Administered 2018-12-02: 0.5 mL via INTRAMUSCULAR
  Filled 2018-12-02: qty 0.5

## 2018-12-02 NOTE — ED Triage Notes (Signed)
Patient is complaining of a fall when he stepped in the flower pot about 2 hours ago. Patient states he did not hit his head. Patient did not loose consciousness. Patient is complaining of right flank pain. He also has a cut on his left shin.

## 2018-12-02 NOTE — ED Notes (Signed)
Patient transported to X-Ray via stretcher.

## 2018-12-02 NOTE — Discharge Instructions (Addendum)
1. Medications: alternate Ibuprofen (800mg  3x per day WITH food) and tylenol (1000mg  4x per day - DO NOT exceed 4g in 24 hours) for pain control, use Oxycodone for breakthrough pain (do not mix with Xanax or Alcohol), Robaxin for muscle spasm 2x per day; usual home medications 2. Treatment: rest, ice, incentive spirometry, drink plenty of fluids, gentle stretching and exercise as tolerated 3. Follow Up: Please followup with your PCP in 1 week for discussion of your diagnoses and further evaluation after today's visit; if you do not have a primary care doctor use the resource guide provided to find one; Please return to the ER for worsening symptoms or other concerns

## 2018-12-02 NOTE — ED Provider Notes (Signed)
[facility] DEPT Provider Note   CSN: [id number] Arrival date & time: 12/02/18  0022     History   Chief Complaint Chief Complaint  Patient presents with  . Fall    HPI Alfred Berry is a 52 y.o. male with a hx of hyperlipidemia, hypertension presents to the Emergency Department complaining of acute, persistent left flank pain onset this evening after a fall.  Patient reports he was taking out the trash when he stumbled down a set of concrete stairs.  Patient reports he did not hit his head or have a loss of consciousness.  He does not think that he struck his back or flank on the stairs however that is where his pain is.  Patient additionally has abrasion to the left anterior shin.  Last tetanus was approximately 5 years ago.  Patient states his pain was initially tolerable and then after sneezing it became 10/10.  He denies shortness of breath.  He denies hitting his head or loss of consciousness.  Patient's wife reports she was able to check on him almost immediately after the fall and he was alert and oriented.  Movement, deep breathing makes symptoms worse.  Nothing makes them better.  No treatments prior to arrival.  Patient denies numbness, tingling, weakness, syncope.     The history is provided by the patient and medical records. No language interpreter was used.    Past Medical History:  Diagnosis Date  . Hyperlipidemia   . Hypertension    borderline    Patient Active Problem List   Diagnosis Date Noted  . Erectile dysfunction 04/24/2013  . Allergic rhinitis 12/15/2012  . Hyperlipidemia 11/12/2011    Past Surgical History:  Procedure Laterality Date  . RECONSTRUCTION OF EYELID  at age 34        Home Medications    Prior to Admission medications   Medication Sig Start Date End Date Taking? Authorizing Provider  ALPRAZolam Duanne Moron) 0.5 MG tablet TAKE 1 TABLET BY MOUTH TWICE DAILY AS NEEDED 12/13/17   Elby Showers, MD  azithromycin  (ZITHROMAX) 250 MG tablet 2 po day 1 followed by one po days 2-5 10/12/17   Elby Showers, MD  finasteride (PROSCAR) 5 MG tablet Take 5 mg by mouth daily.    [provider]  methocarbamol (ROBAXIN) 500 MG tablet Take 1 tablet (500 mg total) by mouth 2 (two) times daily. 12/02/18   Cheralyn Oliver, Jarrett Soho, PA-C  mometasone (NASONEX) 50 MCG/ACT nasal spray SPRAY TWO SPRAYS IN EACH NOSTRIL ONCE DAILY 11/13/17   Elby Showers, MD  Multiple Vitamins-Minerals (MULTIVITAMIN WITH MINERALS) tablet Take 1 tablet by mouth daily.      [provider]  oxyCODONE (ROXICODONE) 5 MG immediate release tablet Take 1 tablet (5 mg total) by mouth every 6 (six) hours as needed for breakthrough pain. 12/02/18   La Dibella, Jarrett Soho, PA-C  sildenafil (VIAGRA) 100 MG tablet FOLLOW DIRECTIONS PROVIDED BY PHYSICIAN 04/21/18   Elby Showers, MD  simvastatin (ZOCOR) 20 MG tablet TAKE 1 TABLET  BY MOUTH AT BEDTIME. 09/02/18   Baxley, Cresenciano Lick, MD    Family History Family History  Problem Relation Age of Onset  . Heart disease Father   . Colon cancer Neg Hx   . Esophageal cancer Neg Hx   . Rectal cancer Neg Hx   . Stomach cancer Neg Hx     Social History Social History   Tobacco Use  . Smoking status: Never Smoker  .  Smokeless tobacco: Never Used  Substance Use Topics  . Alcohol use: Yes    Alcohol/week: 7.0 standard drinks    Types: 7 Glasses of wine per week  . Drug use: No     Allergies   Lincomycin hcl   Review of Systems Review of Systems  Constitutional: Negative for appetite change, diaphoresis, fatigue, fever and unexpected weight change.  HENT: Negative for mouth sores.   Eyes: Negative for visual disturbance.  Respiratory: Negative for cough, chest tightness, shortness of breath and wheezing.   Cardiovascular: Negative for chest pain.  Gastrointestinal: Negative for abdominal pain, constipation, diarrhea, nausea and vomiting.  Endocrine: Negative for polydipsia, polyphagia and  polyuria.  Genitourinary: Positive for flank pain. Negative for dysuria, frequency, hematuria and urgency.  Musculoskeletal: Positive for back pain ( left flank/rib pain). Negative for neck stiffness.  Skin: Negative for rash.  Allergic/Immunologic: Negative for immunocompromised state.  Neurological: Negative for syncope, light-headedness and headaches.  Hematological: Does not bruise/bleed easily.  Psychiatric/Behavioral: Negative for sleep disturbance. The patient is not nervous/anxious.      Physical Exam Updated Vital Signs BP (!) 143/98 (BP Location: Right Arm)   Pulse 76   Temp 98.5 F (36.9 C) (Oral)   Resp 18   Ht 5\' 9"  (1.753 m)   Wt 85 kg   SpO2 99%   BMI 27.67 kg/m   Physical Exam Vitals signs and nursing note reviewed.  Constitutional:      General: He is not in acute distress.    Appearance: He is well-developed. He is not diaphoretic.     Comments: Awake, alert, nontoxic appearance  HENT:     Head: Normocephalic and atraumatic.     Mouth/Throat:     Pharynx: No oropharyngeal exudate.  Eyes:     General: No scleral icterus.    Conjunctiva/sclera: Conjunctivae normal.  Neck:     Musculoskeletal: Full passive range of motion without pain, normal range of motion and neck supple.     Comments: No midline or paraspinal tenderness.  Full range of motion without pain. Cardiovascular:     Rate and Rhythm: Normal rate and regular rhythm.  Pulmonary:     Effort: Pulmonary effort is normal. No respiratory distress.     Breath sounds: Normal breath sounds. No wheezing.  Chest:     Chest wall: Tenderness present.     Comments: Posterior chest wall with large abrasion, contusion and tenderness to palpation along the left posterior ribs.  No flail segment.  No palpable crepitus. Abdominal:     General: Bowel sounds are normal.     Palpations: Abdomen is soft. There is no mass.     Tenderness: There is no abdominal tenderness. There is no guarding or rebound.    Musculoskeletal: Normal range of motion.     Comments: No midline or paraspinal tenderness of the T-spine or L-spine.  Skin:    General: Skin is warm and dry.     Comments: small, superficial abrasion to the skin overlying the left proximal tibia.  No tenderness to palpation of the bony structures underneath.  Full range of motion of the left knee.  Neurological:     Mental Status: He is alert.     Comments: Speech is clear and goal oriented Moves extremities without ataxia      ED Treatments / Results  Labs (all labs ordered are listed, but only abnormal results are displayed) Labs Reviewed  URINALYSIS, ROUTINE W REFLEX MICROSCOPIC - Abnormal; Notable for the  following components:      Result Value   Ketones, ur 5 (*)    All other components within normal limits    Radiology Dg Ribs Unilateral W/chest Left  Result Date: 12/02/2018 CLINICAL DATA:  Fall, stepped on flower pot 2 hours ago. LEFT rib pain. EXAM: LEFT RIBS AND CHEST - 3+ VIEW COMPARISON:  Chest radiograph June 17, 2018 FINDINGS: Cardiomediastinal silhouette is normal. No pleural effusions or focal consolidations. Trachea projects midline and there is no pneumothorax. Acute LEFT eighth and ninth lateral nondisplaced rib fractures. IMPRESSION: 1. Acute nondisplaced LEFT eighth and ninth rib fractures. 2. No acute cardiopulmonary process. Electronically Signed   By: Elon Alas M.D.   On: 12/02/2018 02:08    Procedures Procedures (including critical care time)  Medications Ordered in ED Medications  oxyCODONE (Oxy IR/ROXICODONE) immediate release tablet 10 mg (10 mg Oral Given 12/02/18 0142)  acetaminophen (TYLENOL) tablet 1,000 mg (1,000 mg Oral Given 12/02/18 0142)  Tdap (BOOSTRIX) injection 0.5 mL (0.5 mLs Intramuscular Given 12/02/18 0144)     Initial Impression / Assessment and Plan / ED Course  I have reviewed the triage vital signs and the nursing notes.  Pertinent labs & imaging results that were  available during my care of the patient were reviewed by me and considered in my medical decision making (see chart for details).     Presents with left-sided rib pain after fall.  Urinalysis without gross hematuria.  Less likely to have kidney injury.  Chest x-ray does show left eighth and ninth rib fractures.  Personally evaluated these images.  Patient pain controlled here in the emergency department.  Discussed conservative therapies, breakthrough pain management and narcotic risk with patient.  Will prescribe several days of narcotics for breakthrough pain.  I reviewed the [facility] narcotic database.  Patient does have regular prescription for Xanax, he reports this is for sleep.  No recent prescriptions for narcotics.  She is to follow with his primary care provider in 1 week.  Discussed reasons to return immediately to the emergency department.  Patient and wife state understanding and are in agreement with the plan.  Final Clinical Impressions(s) / ED Diagnoses   Final diagnoses:  Fall, initial encounter  Closed fracture of multiple ribs of left side, initial encounter  Abrasion    ED Discharge Orders         Ordered    methocarbamol (ROBAXIN) 500 MG tablet  2 times daily,   Status:  Discontinued     12/02/18 0246    oxyCODONE (ROXICODONE) 5 MG immediate release tablet  Every 6 hours PRN     12/02/18 0246    methocarbamol (ROBAXIN) 500 MG tablet  2 times daily     12/02/18 0247           Vanessa Kampf, Jarrett Soho, PA-C 12/02/18 0250    Molpus, Jenny Reichmann, MD 12/02/18 [phone]

## 2018-12-13 ENCOUNTER — Other Ambulatory Visit: Payer: BC Managed Care – PPO | Admitting: Internal Medicine

## 2018-12-13 DIAGNOSIS — N400 Enlarged prostate without lower urinary tract symptoms: Secondary | ICD-10-CM

## 2018-12-13 DIAGNOSIS — E785 Hyperlipidemia, unspecified: Secondary | ICD-10-CM

## 2018-12-13 DIAGNOSIS — Z0000 Encounter for general adult medical examination without abnormal findings: Secondary | ICD-10-CM

## 2018-12-13 DIAGNOSIS — R972 Elevated prostate specific antigen [PSA]: Secondary | ICD-10-CM

## 2018-12-13 DIAGNOSIS — N529 Male erectile dysfunction, unspecified: Secondary | ICD-10-CM

## 2018-12-13 DIAGNOSIS — M722 Plantar fascial fibromatosis: Secondary | ICD-10-CM

## 2018-12-14 LAB — CBC WITH DIFFERENTIAL/PLATELET
Absolute Monocytes: 410 cells/uL (ref 200–950)
Basophils Absolute: 40 cells/uL (ref 0–200)
Basophils Relative: 0.7 %
EOS ABS: 103 {cells}/uL (ref 15–500)
Eosinophils Relative: 1.8 %
HCT: 43.5 % (ref 38.5–50.0)
HEMOGLOBIN: 15 g/dL (ref 13.2–17.1)
Lymphs Abs: 1305 cells/uL (ref 850–3900)
MCH: 32.5 pg (ref 27.0–33.0)
MCHC: 34.5 g/dL (ref 32.0–36.0)
MCV: 94.4 fL (ref 80.0–100.0)
MPV: 10.4 fL (ref 7.5–12.5)
Monocytes Relative: 7.2 %
Neutro Abs: 3842 cells/uL (ref 1500–7800)
Neutrophils Relative %: 67.4 %
Platelets: 309 10*3/uL (ref 140–400)
RBC: 4.61 10*6/uL (ref 4.20–5.80)
RDW: 12.4 % (ref 11.0–15.0)
Total Lymphocyte: 22.9 %
WBC: 5.7 10*3/uL (ref 3.8–10.8)

## 2018-12-14 LAB — LIPID PANEL
Cholesterol: 173 mg/dL (ref <200)
HDL: 49 mg/dL (ref >40)
LDL Cholesterol (Calc): 105 mg/dL (calc) — ABNORMAL HIGH
Non-HDL Cholesterol (Calc): 124 mg/dL (calc) (ref <130)
Total CHOL/HDL Ratio: 3.5 (calc) (ref <5.0)
Triglycerides: 92 mg/dL (ref <150)

## 2018-12-14 LAB — COMPLETE METABOLIC PANEL WITH GFR
AG Ratio: 1.8 (calc) (ref 1.0–2.5)
ALT: 16 U/L (ref 9–46)
AST: 17 U/L (ref 10–35)
Albumin: 4.7 g/dL (ref 3.6–5.1)
Alkaline phosphatase (APISO): 46 U/L (ref 40–115)
BUN: 13 mg/dL (ref 7–25)
CALCIUM: 9.8 mg/dL (ref 8.6–10.3)
CO2: 30 mmol/L (ref 20–32)
Chloride: 101 mmol/L (ref 98–110)
Creat: 0.98 mg/dL (ref 0.70–1.33)
GFR, EST NON AFRICAN AMERICAN: 88 mL/min/{1.73_m2} (ref >=60)
GFR, Est African American: 102 mL/min/{1.73_m2} (ref >=60)
Globulin: 2.6 g/dL (calc) (ref 1.9–3.7)
Glucose, Bld: 93 mg/dL (ref 65–99)
Potassium: 4.7 mmol/L (ref 3.5–5.3)
Sodium: 139 mmol/L (ref 135–146)
Total Bilirubin: 0.7 mg/dL (ref 0.2–1.2)
Total Protein: 7.3 g/dL (ref 6.1–8.1)

## 2018-12-14 LAB — PSA: PSA: 1.7 ng/mL (ref <=4.0)

## 2018-12-16 ENCOUNTER — Encounter: Payer: Self-pay | Admitting: Internal Medicine

## 2018-12-16 ENCOUNTER — Ambulatory Visit (INDEPENDENT_AMBULATORY_CARE_PROVIDER_SITE_OTHER): Payer: BC Managed Care – PPO | Admitting: Internal Medicine

## 2018-12-16 VITALS — BP 130/80 | HR 78 | Ht 67.0 in | Wt 184.0 lb

## 2018-12-16 DIAGNOSIS — N401 Enlarged prostate with lower urinary tract symptoms: Secondary | ICD-10-CM

## 2018-12-16 DIAGNOSIS — Z0000 Encounter for general adult medical examination without abnormal findings: Secondary | ICD-10-CM | POA: Diagnosis not present

## 2018-12-16 DIAGNOSIS — J22 Unspecified acute lower respiratory infection: Secondary | ICD-10-CM | POA: Diagnosis not present

## 2018-12-16 DIAGNOSIS — S2242XD Multiple fractures of ribs, left side, subsequent encounter for fracture with routine healing: Secondary | ICD-10-CM

## 2018-12-16 DIAGNOSIS — N529 Male erectile dysfunction, unspecified: Secondary | ICD-10-CM

## 2018-12-16 DIAGNOSIS — J309 Allergic rhinitis, unspecified: Secondary | ICD-10-CM

## 2018-12-16 DIAGNOSIS — E785 Hyperlipidemia, unspecified: Secondary | ICD-10-CM

## 2018-12-16 DIAGNOSIS — R351 Nocturia: Secondary | ICD-10-CM

## 2018-12-16 LAB — POCT URINALYSIS DIPSTICK
Appearance: NEGATIVE
Bilirubin, UA: NEGATIVE
Blood, UA: NEGATIVE
Glucose, UA: NEGATIVE
KETONES UA: NEGATIVE
Leukocytes, UA: NEGATIVE
Nitrite, UA: NEGATIVE
ODOR: NEGATIVE
Protein, UA: NEGATIVE
Spec Grav, UA: 1.01 (ref 1.010–1.025)
Urobilinogen, UA: 0.2 E.U./dL
pH, UA: 6.5 (ref 5.0–8.0)

## 2018-12-16 MED ORDER — AZITHROMYCIN 250 MG PO TABS: ORAL_TABLET | ORAL | 0 refills | Status: DC

## 2018-12-16 NOTE — Patient Instructions (Addendum)
Take Zithromax Z pak as prescribed for protracted cough. Continue follow up with Dr. Tresa Moore for BPH.RTC one year or prn. Form signed for massage therapy at pt request which is something done annually

## 2018-12-16 NOTE — Progress Notes (Signed)
Subjective:    Patient ID: Alfred Berry, male    DOB: [birth year], 53 y.o.   MRN: [id number]  HPI 53 year old Male for health maintenance exam and evaluation of medical issues.  Has had respiratory infection x 3 weeks. Coughing a lot. No fever or chills.   On Dec 23, had fall at home down back steps -tripped over flower pots and had nondisplaced fractures- left 8th and 9th ribs. Seen in ED. Tetanus updated then.  Discussed Shingrix- will defer for now  Followed by Dr. Tresa Moore for BPH.  Is on Proscar.  Also Viagra as needed erectile dysfunction.  Patient had elevated PSA of 4.03 in 2017 requiring prostate biopsy which proved to be negative.  History of plantar fasciitis for which he has seen podiatrist.  Colonoscopy October 2017.  2 polyps removed.  One was a sessile adenoma.  Repeat study recommended in 5 years.  In February 2016 he had Herpes zoster affecting left scalp and left eye.  He did see ophthalmologist.  Apparently did not have ophthalmic involvement.  Was treated here with Valtrex and Norco and did well.  Takes Zocor for hyperlipidemia.  Had cosmetic surgery 1975.  History of mild obesity and borderline hypertension.  In April 2018 he had severe right-sided back pain with radiculopathy that subsequently resolved after physical therapy and medical treatment with steroids and muscle relaxants.  Gets annual flu vaccine.  History of allergic rhinitis.  History of mild anxiety treated with Xanax when he travels to [place].  History of nocturia and erectile dysfunction treated by Dr. Tresa Moore yesterday hello hello hello this is not a convenient time I am sorry to follow-up  Family history: Father with history of coronary artery bypass surgery in 2002 with history of hypertension.  Father was adopted so not much is known about paternal family history.  Mother living in good health.  Social history: Does not smoke.  Social alcohol consumption.  He is a Health and safety inspector at  [employer].  Wife is an art history professor who also teaches at [employer].  No children.      Review of Systems protracted URI symptoms with cough and congestion malaise and fatigue     Objective:   Physical Exam Vitals signs reviewed.  Constitutional:      General: He is not in acute distress.    Appearance: Normal appearance. He is not ill-appearing, toxic-appearing or diaphoretic.  HENT:     Head: Normocephalic and atraumatic.     Right Ear: Tympanic membrane normal.     Left Ear: Tympanic membrane normal.     Nose:     Comments: Sounds hoarse and nasally congested when he speaks    Mouth/Throat:     Pharynx: Oropharynx is clear. No oropharyngeal exudate or posterior oropharyngeal erythema.  Eyes:     General: No scleral icterus.       Right eye: No discharge.        Left eye: No discharge.     Conjunctiva/sclera: Conjunctivae normal.  Neck:     Musculoskeletal: Neck supple. No neck rigidity.  Cardiovascular:     Rate and Rhythm: Normal rate and regular rhythm.     Heart sounds: No murmur. No gallop.   Pulmonary:     Effort: Pulmonary effort is normal. No respiratory distress.     Breath sounds: Normal breath sounds. No wheezing, rhonchi or rales.  Abdominal:     General: Bowel sounds are normal.     Palpations: Abdomen is  soft. There is no mass.     Tenderness: There is no rebound.     Hernia: No hernia is present.  Genitourinary:    Comments: Deferred to Dr. Tresa Moore. Musculoskeletal:     Comments: Left eighth and ninth rib tenderness  Lymphadenopathy:     Cervical: No cervical adenopathy.  Skin:    General: Skin is warm and dry.  Neurological:     General: No focal deficit present.     Mental Status: He is alert and oriented to person, place, and time.     Cranial Nerves: No cranial nerve deficit.     Sensory: No sensory deficit.     Motor: No weakness.     Gait: Gait normal.  Psychiatric:        Mood and Affect: Mood normal.        Behavior: Behavior normal.         Thought Content: Thought content normal.        Judgment: Judgment normal.           Assessment & Plan:  Hyperlipidemia-stable on statin therapy.  Very mild elevation of LDL at 105.  Triglycerides normal.  Total cholesterol 173.  HDL 49.  Acute lower respiratory infection- treat with Zithromax Z-PAK take 2 tablets day 1 followed by 1 tablet days 2 through 5  History of anxiety mainly traveling to [place] treated sparingly with Xanax  History of plantar fasciitis  History of lumbar back pain with radiculopathy-no recent issues  Allergic rhinitis treated with steroid nasal spray  History of fall at home with rib fractures December 23-tripped over flowerpot going down the back door steps at night.  Nondisplaced left eighth and ninth rib fractures   BPH treated by Dr. Tresa Moore  Erectile dysfunction-treated by Dr. Tresa Moore  BMI -28.82-work on diet exercise and weight loss

## 2019-01-03 ENCOUNTER — Other Ambulatory Visit: Payer: Self-pay | Admitting: Internal Medicine

## 2019-01-08 ENCOUNTER — Encounter: Payer: Self-pay | Admitting: Internal Medicine

## 2019-05-04 ENCOUNTER — Other Ambulatory Visit: Payer: Self-pay | Admitting: Internal Medicine

## 2019-06-25 ENCOUNTER — Telehealth: Payer: Self-pay | Admitting: Internal Medicine

## 2019-06-25 MED ORDER — ALPRAZOLAM 0.5 MG PO TABS: 0.5000 mg | ORAL_TABLET | Freq: Two times a day (BID) | ORAL | 1 refills | Status: DC | PRN

## 2019-06-25 NOTE — Telephone Encounter (Signed)
Xanax refilled as requested

## 2019-09-23 ENCOUNTER — Ambulatory Visit (INDEPENDENT_AMBULATORY_CARE_PROVIDER_SITE_OTHER): Payer: BC Managed Care – PPO | Admitting: Internal Medicine

## 2019-09-23 ENCOUNTER — Other Ambulatory Visit: Payer: Self-pay

## 2019-09-23 ENCOUNTER — Encounter: Payer: Self-pay | Admitting: Internal Medicine

## 2019-09-23 DIAGNOSIS — Z23 Encounter for immunization: Secondary | ICD-10-CM | POA: Diagnosis not present

## 2019-09-23 NOTE — Progress Notes (Signed)
Flu vaccine given by CMA 

## 2019-09-23 NOTE — Patient Instructions (Signed)
Patient received a flu vaccine IM L deltoid, AV, CMA  

## 2019-11-14 ENCOUNTER — Telehealth: Payer: Self-pay | Admitting: Podiatry

## 2019-11-14 NOTE — Telephone Encounter (Signed)
Pt left message asking about possibly getting a new pair of orthotics. He got last ones in 2018.   I returned call and left message for pt to call to discuss further.Marland Kitchen

## 2019-11-27 ENCOUNTER — Other Ambulatory Visit: Payer: Self-pay | Admitting: Internal Medicine

## 2019-11-27 MED ORDER — SILDENAFIL CITRATE 100 MG PO TABS: ORAL_TABLET | ORAL | 1 refills | Status: DC

## 2019-11-27 NOTE — Telephone Encounter (Signed)
Received Fax RX request from  [place], [place] Phone:  [phone]  Fax:  [phone]       Medication - sildenafil (VIAGRA) 100 MG tablet   Last Refill - 04/22/18  Last OV - 12/16/18  Last CPE - 12/16/18  Next Appointment - 12/22/19

## 2019-11-27 NOTE — Telephone Encounter (Signed)
Pt returned call and is scheduled to see Dr Lemmie Evens 12.22 for a quick visit for new orthotics.

## 2019-11-27 NOTE — Telephone Encounter (Signed)
CPE scheduled in January.

## 2019-12-01 ENCOUNTER — Other Ambulatory Visit: Payer: Self-pay | Admitting: Internal Medicine

## 2019-12-01 ENCOUNTER — Other Ambulatory Visit: Payer: Self-pay

## 2019-12-01 MED ORDER — SILDENAFIL CITRATE 100 MG PO TABS: ORAL_TABLET | ORAL | 1 refills | Status: DC

## 2019-12-02 ENCOUNTER — Ambulatory Visit (INDEPENDENT_AMBULATORY_CARE_PROVIDER_SITE_OTHER): Payer: BC Managed Care – PPO | Admitting: Podiatry

## 2019-12-02 ENCOUNTER — Other Ambulatory Visit: Payer: Self-pay

## 2019-12-02 DIAGNOSIS — M722 Plantar fascial fibromatosis: Secondary | ICD-10-CM

## 2019-12-02 NOTE — Progress Notes (Signed)
He presents today with a history of plantar fasciitis to the right foot.  He states that his orthotics have been doing just fine but he would like to have another set of orthotics made.  He was seen by Liliane Channel today for a set of orthotics to be fabricated.

## 2019-12-19 ENCOUNTER — Other Ambulatory Visit: Payer: BC Managed Care – PPO | Admitting: Internal Medicine

## 2019-12-22 ENCOUNTER — Other Ambulatory Visit: Payer: Self-pay

## 2019-12-22 ENCOUNTER — Ambulatory Visit (INDEPENDENT_AMBULATORY_CARE_PROVIDER_SITE_OTHER): Payer: BC Managed Care – PPO | Admitting: Internal Medicine

## 2019-12-22 ENCOUNTER — Encounter: Payer: Self-pay | Admitting: Internal Medicine

## 2019-12-22 VITALS — BP 130/80 | HR 73 | Ht 67.0 in | Wt 194.0 lb

## 2019-12-22 DIAGNOSIS — N401 Enlarged prostate with lower urinary tract symptoms: Secondary | ICD-10-CM | POA: Diagnosis not present

## 2019-12-22 DIAGNOSIS — R351 Nocturia: Secondary | ICD-10-CM

## 2019-12-22 DIAGNOSIS — N529 Male erectile dysfunction, unspecified: Secondary | ICD-10-CM | POA: Diagnosis not present

## 2019-12-22 DIAGNOSIS — E785 Hyperlipidemia, unspecified: Secondary | ICD-10-CM | POA: Diagnosis not present

## 2019-12-22 DIAGNOSIS — J309 Allergic rhinitis, unspecified: Secondary | ICD-10-CM

## 2019-12-22 DIAGNOSIS — Z0000 Encounter for general adult medical examination without abnormal findings: Secondary | ICD-10-CM

## 2019-12-22 DIAGNOSIS — Z131 Encounter for screening for diabetes mellitus: Secondary | ICD-10-CM

## 2019-12-22 LAB — POCT URINALYSIS DIPSTICK
Appearance: NEGATIVE
Bilirubin, UA: NEGATIVE
Blood, UA: NEGATIVE
Glucose, UA: NEGATIVE
Ketones, UA: NEGATIVE
Leukocytes, UA: NEGATIVE
Nitrite, UA: NEGATIVE
Odor: NEGATIVE
Protein, UA: NEGATIVE
Spec Grav, UA: 1.01 (ref 1.010–1.025)
Urobilinogen, UA: 0.2 E.U./dL
pH, UA: 6.5 (ref 5.0–8.0)

## 2019-12-22 NOTE — Progress Notes (Signed)
   Subjective:    Patient ID: Alfred Berry, male    DOB: 1966/12/11, 54 y.o.   MRN: [id number]  HPI 54 year old Male seen today for health maintenance exam and evaluation of medical issues.  History of BPH treated by urologist with Proscar.  Also takes Viagra as needed for erectile dysfunction.  Patient had PSA of 4.03 in 2017 requiring prostate biopsy which proved to be negative.  History of plantar fasciitis for which he is seeing podiatrist.  A colonoscopy October 2017 with 2 polyps being removed.  One was a sessile adenoma.  Repeat study recommended in 5 years.  In February 2016 he had herpes zoster affecting left scalp and left.  He did see ophthalmologist and did not have ophthalmic involvement with herpes zoster.  He was treated with Valtrex and Norco.  He did well.  Take Zocor for hyperlipidemia.  History of mild obesity and borderline hypertension.  Had cosmetic surgery 1975.  In April 2018 had severe right-sided back pain radiculopathy that subsequently resolved after physical therapy and medical treatment with steroids and muscle relaxants.  History of allergic rhinitis.  History of mild anxiety treated with Xanax when he travels to [place].  Social history: Does not smoke.  Social alcohol consumption.  He is a Health and safety inspector at [employer].  Wife is an art history professor who also teaches at [employer].  No children.  Family history: Father with history of coronary artery bypass surgery in 2002 with history of hypertension.  Not much is known about paternal family history as his father was adopted.  Mother living in good health.    Review of Systems  Constitutional: Negative.   Respiratory: Negative.   Cardiovascular: Negative.   Neurological: Negative.   Psychiatric/Behavioral: Negative.        Objective:   Physical Exam Blood pressure 140/90 rechecked 130/80.  BMI 30.38 weight 194 pounds.  Pulse 73 and regular  Skin warm and dry.  Nodes none.  TMs are clear.   Neck is supple without JVD thyromegaly or carotid bruits.  Chest clear to auscultation.  Cardiac exam regular rate and rhythm normal S1 and S2 without murmurs or gallops.  No adenopathy.  Abdomen no hepatosplenomegaly masses or tenderness.  No lower extremity edema.  Neuro no focal deficits on brief neurological exam.  Affect thought and judgment are within normal limits.       Assessment & Plan:  BPH treated with Proscar by urologist  History of erectile dysfunction treated with Viagra by urologist  Hyperlipidemia stable on statin therapy  History of anxiety with traveling to [place] treated sparingly with Xanax  History of plantar fasciitis  History of lumbar back pain with radiculopathy with no recent issues  Allergic rhinitis treated with steroid nasal spray  History of rib fractures December 2019 after a fall at home-had nondisplaced left eighth and ninth rib fractures.  Plan: Continue current medications and return in 1 year or as needed.  Labs are within normal limits.  LDL is 110 which I think is good for the pandemic.  No change in medications.  He will continue with Zocor 20 mg daily.

## 2019-12-23 ENCOUNTER — Other Ambulatory Visit: Payer: Self-pay

## 2019-12-23 DIAGNOSIS — R7309 Other abnormal glucose: Secondary | ICD-10-CM

## 2019-12-24 LAB — HEMOGLOBIN A1C W/OUT EAG: Hgb A1c MFr Bld: 5.4 % of total Hgb (ref <5.7)

## 2019-12-24 LAB — LIPID PANEL
Cholesterol: 181 mg/dL (ref <200)
HDL: 48 mg/dL (ref >=40)
LDL Cholesterol (Calc): 110 mg/dL (calc) — ABNORMAL HIGH
Non-HDL Cholesterol (Calc): 133 mg/dL (calc) — ABNORMAL HIGH (ref <130)
Total CHOL/HDL Ratio: 3.8 (calc) (ref <5.0)
Triglycerides: 122 mg/dL (ref <150)

## 2019-12-24 LAB — CBC WITH DIFFERENTIAL/PLATELET
Absolute Monocytes: 381 cells/uL (ref 200–950)
Basophils Absolute: 29 cells/uL (ref 0–200)
Basophils Relative: 0.7 %
Eosinophils Absolute: 103 cells/uL (ref 15–500)
Eosinophils Relative: 2.5 %
HCT: 44.1 % (ref 38.5–50.0)
Hemoglobin: 14.7 g/dL (ref 13.2–17.1)
Lymphs Abs: 1693 cells/uL (ref 850–3900)
MCH: 31.5 pg (ref 27.0–33.0)
MCHC: 33.3 g/dL (ref 32.0–36.0)
MCV: 94.6 fL (ref 80.0–100.0)
MPV: 10.4 fL (ref 7.5–12.5)
Monocytes Relative: 9.3 %
Neutro Abs: 1894 cells/uL (ref 1500–7800)
Neutrophils Relative %: 46.2 %
Platelets: 266 10*3/uL (ref 140–400)
RBC: 4.66 10*6/uL (ref 4.20–5.80)
RDW: 12.2 % (ref 11.0–15.0)
Total Lymphocyte: 41.3 %
WBC: 4.1 10*3/uL (ref 3.8–10.8)

## 2019-12-24 LAB — TEST AUTHORIZATION

## 2019-12-24 LAB — COMPLETE METABOLIC PANEL WITH GFR
AG Ratio: 1.8 (calc) (ref 1.0–2.5)
ALT: 19 U/L (ref 9–46)
AST: 18 U/L (ref 10–35)
Albumin: 4.5 g/dL (ref 3.6–5.1)
Alkaline phosphatase (APISO): 28 U/L — ABNORMAL LOW (ref 35–144)
BUN: 20 mg/dL (ref 7–25)
CO2: 26 mmol/L (ref 20–32)
Calcium: 9.6 mg/dL (ref 8.6–10.3)
Chloride: 105 mmol/L (ref 98–110)
Creat: 1.07 mg/dL (ref 0.70–1.33)
GFR, Est African American: 91 mL/min/{1.73_m2} (ref >=60)
GFR, Est Non African American: 79 mL/min/{1.73_m2} (ref >=60)
Globulin: 2.5 g/dL (calc) (ref 1.9–3.7)
Glucose, Bld: 103 mg/dL — ABNORMAL HIGH (ref 65–99)
Potassium: 4.2 mmol/L (ref 3.5–5.3)
Sodium: 140 mmol/L (ref 135–146)
Total Bilirubin: 0.6 mg/dL (ref 0.2–1.2)
Total Protein: 7 g/dL (ref 6.1–8.1)

## 2019-12-24 LAB — PSA: PSA: 1.5 ng/mL (ref <=4.0)

## 2019-12-25 ENCOUNTER — Other Ambulatory Visit: Payer: Self-pay | Admitting: Internal Medicine

## 2020-01-10 NOTE — Patient Instructions (Signed)
It was a pleasure to see you today.  Continue current medications and follow-up in 1 year or as needed.

## 2020-03-09 ENCOUNTER — Other Ambulatory Visit: Payer: Self-pay | Admitting: Internal Medicine

## 2020-04-01 ENCOUNTER — Telehealth: Payer: Self-pay

## 2020-04-01 ENCOUNTER — Encounter: Payer: Self-pay | Admitting: Internal Medicine

## 2020-04-01 ENCOUNTER — Other Ambulatory Visit: Payer: Self-pay

## 2020-04-01 ENCOUNTER — Telehealth (INDEPENDENT_AMBULATORY_CARE_PROVIDER_SITE_OTHER): Payer: BC Managed Care – PPO | Admitting: Internal Medicine

## 2020-04-01 VITALS — Ht 67.0 in | Wt 194.0 lb

## 2020-04-01 DIAGNOSIS — S39012A Strain of muscle, fascia and tendon of lower back, initial encounter: Secondary | ICD-10-CM

## 2020-04-01 MED ORDER — HYDROCODONE-ACETAMINOPHEN 10-325 MG PO TABS: 1.0000 | ORAL_TABLET | Freq: Three times a day (TID) | ORAL | 0 refills | Status: AC | PRN

## 2020-04-01 MED ORDER — CYCLOBENZAPRINE HCL 10 MG PO TABS: 10.0000 mg | ORAL_TABLET | Freq: Three times a day (TID) | ORAL | 2 refills | Status: DC | PRN

## 2020-04-01 MED ORDER — PREDNISONE 10 MG PO TABS: ORAL_TABLET | ORAL | 0 refills | Status: DC

## 2020-04-01 NOTE — Progress Notes (Signed)
   Subjective:    Patient ID: Alfred Berry, male    DOB: January 17, 1966, 54 y.o.   MRN: [id number]  HPI 54 year old Male seen today via interactive audio and video telecommunications due to the Coronavirus pandemic.  He is identified using 2 identifiers as Alfred Berry ,  a patient in this practice.  He is agreeable to visit in this format today.  Patient has history of low back pain.  In April 2018, he had severe right-sided back pain with radiculopathy that subsequently resolved after physical therapy and medical treatment with steroids and muscle relaxants.  He was getting out of the shower today and experienced immediate low back pain.  Cannot get comfortable at the present time.  Was unable to leave his house to get to the office today due to pain.    Review of Systems generalized bilateral lower back pain without radiculopathy at this point in time.  No numbness and tingling in extremities.     Objective:   Physical Exam Patient is seen virtually and looks to be in pain.  Straight leg raising at 90 degrees does not cause pain.       Assessment & Plan:  Acute low back strain  Plan: Prednisone 10 mg (#42) tapering over 12 days starting with 6 tablets and decreasing by 10 mg every 2 days.  Flexeril 10 mg up to 3 times a day as needed.  Norco 10/325 1 tablet by mouth every 8 hours with food as needed for severe back pain.  Referral made to [facility] for physical therapy.  Call if not better in 2 to 3 weeks or sooner if worse.

## 2020-04-01 NOTE — Patient Instructions (Addendum)
Take Flexeril up to 3 times daily as needed for back pain.  Norco 10/325 sparingly 3 times daily as needed for severe back pain.  Take prednisone in tapering course over 12 days as directed.  Physical therapy ordered.  Call if not improving in 2-3 weeks or sooner if worse

## 2020-04-01 NOTE — Telephone Encounter (Signed)
Patient's wife called states that he has hurt is back getting out of the shower and is in pain.  Wants to know if you will send in pain med and steroids to the pharm,acy.     Per Dr Renold Genta Patient needs to be seen either her in office or Virtually.  Spoke with patient's they will do a virtual visit at 1230.

## 2020-09-24 ENCOUNTER — Ambulatory Visit: Payer: BC Managed Care – PPO | Admitting: Internal Medicine

## 2020-10-01 ENCOUNTER — Ambulatory Visit (INDEPENDENT_AMBULATORY_CARE_PROVIDER_SITE_OTHER): Payer: BC Managed Care – PPO | Admitting: Internal Medicine

## 2020-10-01 ENCOUNTER — Other Ambulatory Visit: Payer: Self-pay

## 2020-10-01 VITALS — BP 130/80 | HR 80 | Temp 97.7°F | Ht 67.0 in | Wt 194.0 lb

## 2020-10-01 DIAGNOSIS — Z23 Encounter for immunization: Secondary | ICD-10-CM

## 2020-10-01 NOTE — Progress Notes (Signed)
   Subjective:    Patient ID: Alfred Berry, male    DOB: [birth year], 54 y.o.   MRN: [id number]  HPI Flu vaccine given by CMA    Review of Systems     Objective:   Physical Exam        Assessment & Plan:

## 2020-10-01 NOTE — Patient Instructions (Signed)
Patient received a flu vaccine IM L deltoid, AV, CMA  

## 2020-10-29 ENCOUNTER — Ambulatory Visit: Payer: BC Managed Care – PPO | Attending: Internal Medicine

## 2020-10-29 DIAGNOSIS — Z23 Encounter for immunization: Secondary | ICD-10-CM

## 2020-10-29 NOTE — Progress Notes (Signed)
   Covid-19 Vaccination Clinic  Name:  Alfred Berry    MRN: [id number] DOB: [birth year]  10/29/2020  [name] was observed post Covid-19 immunization for 15 minutes without incident. He was provided with Vaccine Information Sheet and instruction to access the V-Safe system.   [name] was instructed to call 911 with any severe reactions post vaccine: Marland Kitchen Difficulty breathing  . Swelling of face and throat  . A fast heartbeat  . A bad rash all over body  . Dizziness and weakness   Immunizations Administered    Name Date Dose VIS Date Route   Pfizer COVID-19 Vaccine 10/29/2020  3:38 PM 0.3 mL 09/29/2020 Intramuscular   Manufacturer: [place]   Lot: [id number]   [place]: [id number]

## 2020-11-26 IMAGING — CR DG RIBS W/ CHEST 3+V*L*
4 series · 4 of 4 positions shown · non-contrast
Comparison: Chest radiograph June 17, 2018

CLINICAL DATA: Fall, stepped on flower pot 2 hours ago. LEFT rib
pain.

EXAM:
LEFT RIBS AND CHEST - 3+ VIEW

[w chest pa]
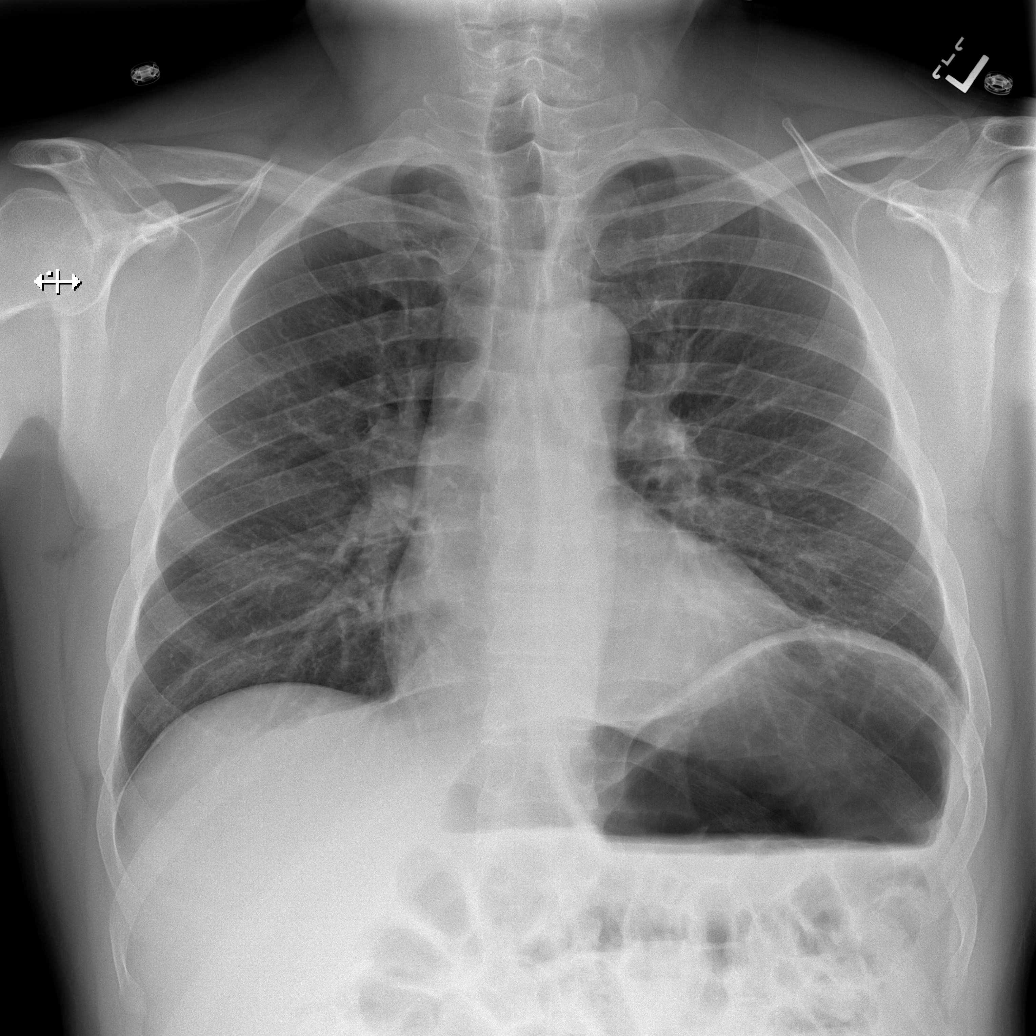

[w ribs ap upper left]
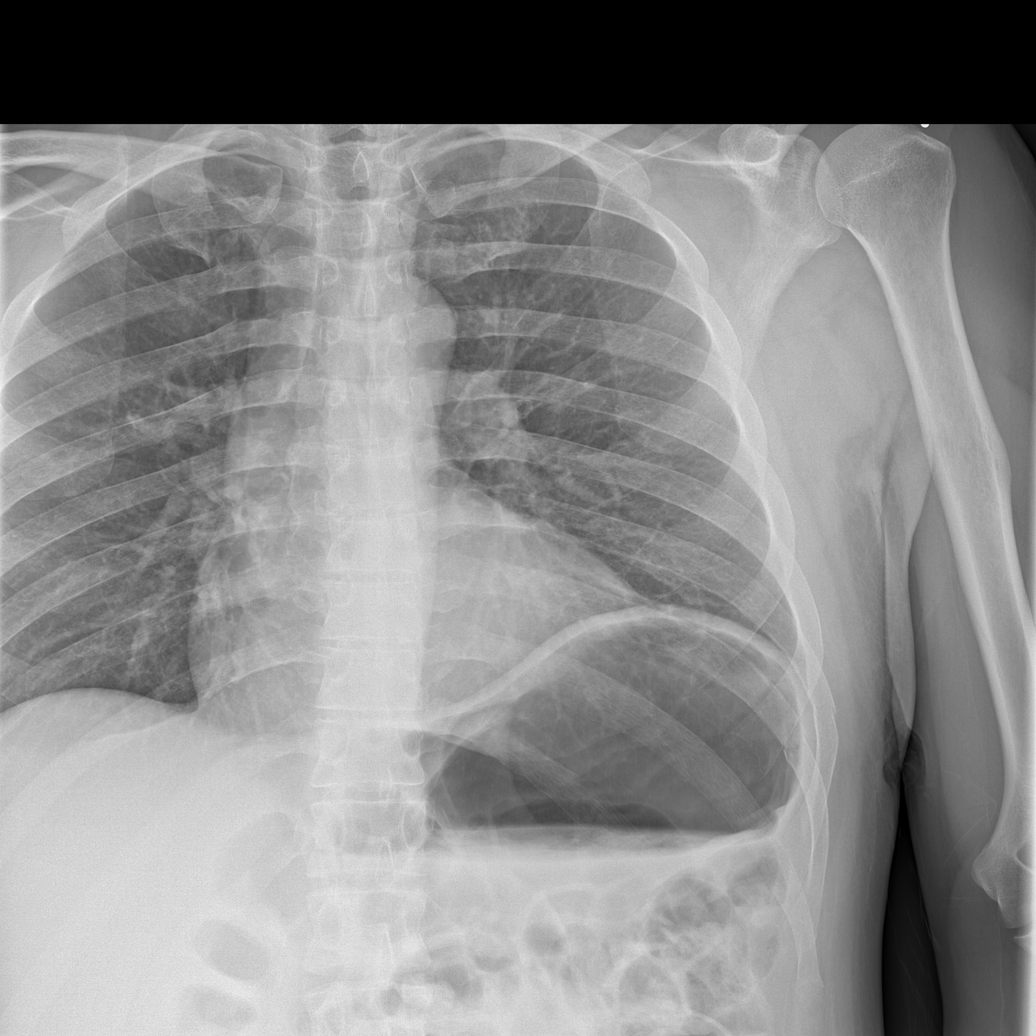

[w ribs ap lower left]
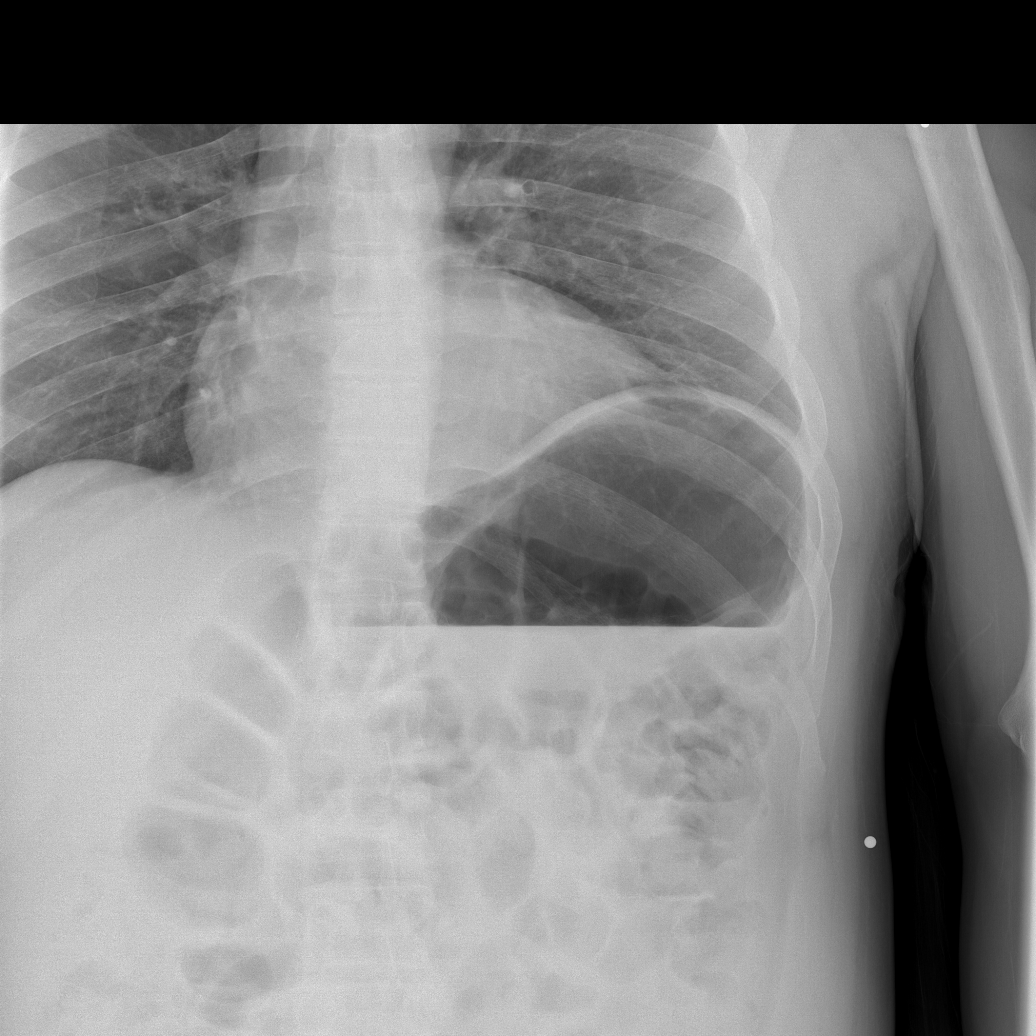

[w ribs obl left]
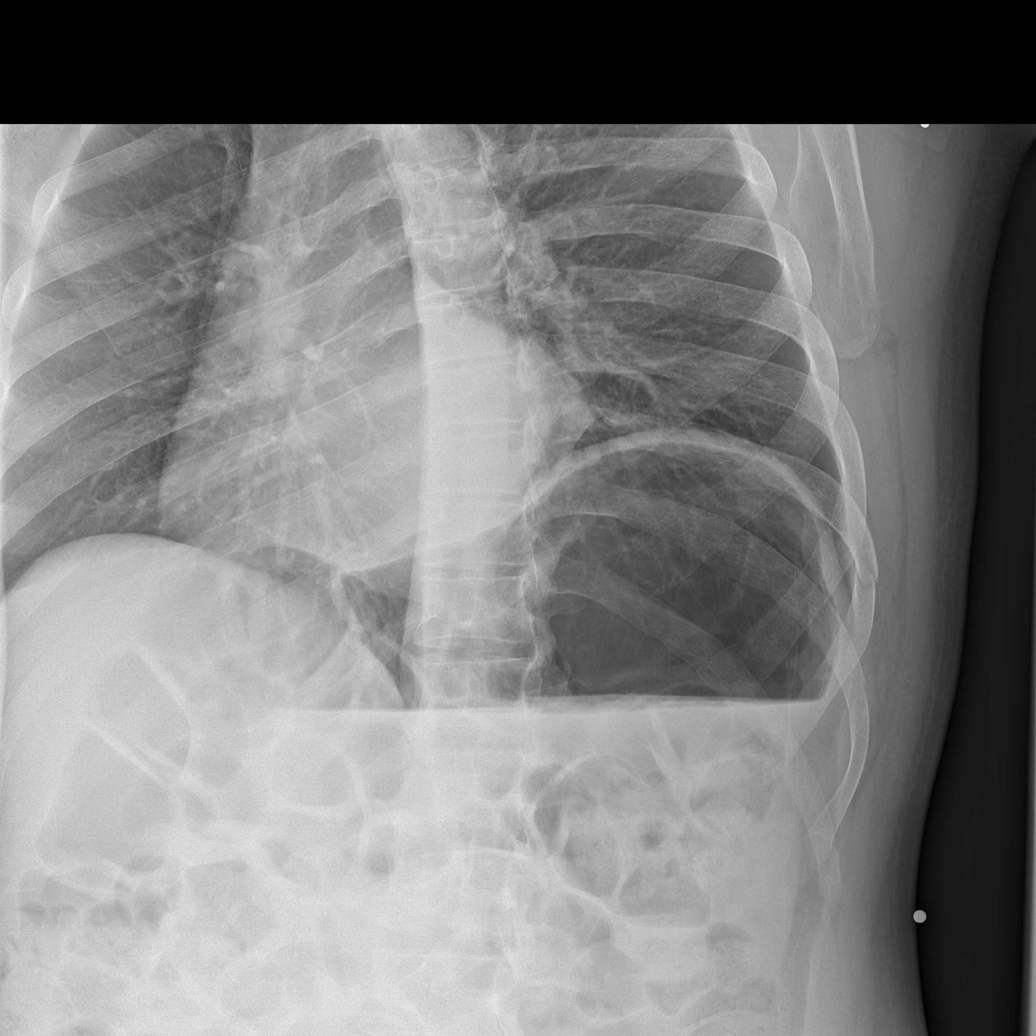

[4 of 4 positions shown; findings below may reference images not displayed]

FINDINGS: Cardiomediastinal silhouette is normal. No pleural effusions or
focal consolidations. Trachea projects midline and there is no
pneumothorax. Acute LEFT eighth and ninth lateral nondisplaced rib
fractures.
IMPRESSION: 1. Acute nondisplaced LEFT eighth and ninth rib fractures.
2. No acute cardiopulmonary process.

## 2020-12-20 ENCOUNTER — Other Ambulatory Visit: Payer: BC Managed Care – PPO | Admitting: Internal Medicine

## 2020-12-20 ENCOUNTER — Other Ambulatory Visit: Payer: Self-pay

## 2020-12-20 DIAGNOSIS — E785 Hyperlipidemia, unspecified: Secondary | ICD-10-CM

## 2020-12-20 DIAGNOSIS — E7800 Pure hypercholesterolemia, unspecified: Secondary | ICD-10-CM

## 2020-12-20 DIAGNOSIS — R7309 Other abnormal glucose: Secondary | ICD-10-CM

## 2020-12-20 DIAGNOSIS — N529 Male erectile dysfunction, unspecified: Secondary | ICD-10-CM

## 2020-12-20 DIAGNOSIS — Z0000 Encounter for general adult medical examination without abnormal findings: Secondary | ICD-10-CM

## 2020-12-20 DIAGNOSIS — Z125 Encounter for screening for malignant neoplasm of prostate: Secondary | ICD-10-CM

## 2020-12-21 LAB — CBC WITH DIFFERENTIAL/PLATELET
Absolute Monocytes: 289 cells/uL (ref 200–950)
Basophils Absolute: 30 cells/uL (ref 0–200)
Basophils Relative: 0.8 %
Eosinophils Absolute: 70 cells/uL (ref 15–500)
Eosinophils Relative: 1.9 %
HCT: 41.6 % (ref 38.5–50.0)
Hemoglobin: 14.2 g/dL (ref 13.2–17.1)
Lymphs Abs: 1236 cells/uL (ref 850–3900)
MCH: 32.3 pg (ref 27.0–33.0)
MCHC: 34.1 g/dL (ref 32.0–36.0)
MCV: 94.8 fL (ref 80.0–100.0)
MPV: 10.7 fL (ref 7.5–12.5)
Monocytes Relative: 7.8 %
Neutro Abs: 2076 cells/uL (ref 1500–7800)
Neutrophils Relative %: 56.1 %
Platelets: 234 10*3/uL (ref 140–400)
RBC: 4.39 10*6/uL (ref 4.20–5.80)
RDW: 12.4 % (ref 11.0–15.0)
Total Lymphocyte: 33.4 %
WBC: 3.7 10*3/uL — ABNORMAL LOW (ref 3.8–10.8)

## 2020-12-21 LAB — LIPID PANEL
Cholesterol: 177 mg/dL (ref <200)
HDL: 56 mg/dL (ref >=40)
LDL Cholesterol (Calc): 104 mg/dL (calc) — ABNORMAL HIGH
Non-HDL Cholesterol (Calc): 121 mg/dL (calc) (ref <130)
Total CHOL/HDL Ratio: 3.2 (calc) (ref <5.0)
Triglycerides: 83 mg/dL (ref <150)

## 2020-12-21 LAB — COMPLETE METABOLIC PANEL WITH GFR
AG Ratio: 1.9 (calc) (ref 1.0–2.5)
ALT: 20 U/L (ref 9–46)
AST: 18 U/L (ref 10–35)
Albumin: 4.4 g/dL (ref 3.6–5.1)
Alkaline phosphatase (APISO): 25 U/L — ABNORMAL LOW (ref 35–144)
BUN: 20 mg/dL (ref 7–25)
CO2: 27 mmol/L (ref 20–32)
Calcium: 9.5 mg/dL (ref 8.6–10.3)
Chloride: 106 mmol/L (ref 98–110)
Creat: 1 mg/dL (ref 0.70–1.33)
GFR, Est African American: 98 mL/min/{1.73_m2} (ref >=60)
GFR, Est Non African American: 85 mL/min/{1.73_m2} (ref >=60)
Globulin: 2.3 g/dL (calc) (ref 1.9–3.7)
Glucose, Bld: 104 mg/dL — ABNORMAL HIGH (ref 65–99)
Potassium: 4.3 mmol/L (ref 3.5–5.3)
Sodium: 140 mmol/L (ref 135–146)
Total Bilirubin: 0.6 mg/dL (ref 0.2–1.2)
Total Protein: 6.7 g/dL (ref 6.1–8.1)

## 2020-12-21 LAB — HEMOGLOBIN A1C
Hgb A1c MFr Bld: 5.5 % of total Hgb (ref <5.7)
Mean Plasma Glucose: 111 mg/dL
eAG (mmol/L): 6.2 mmol/L

## 2020-12-21 LAB — PSA: PSA: 1.34 ng/mL (ref <=4.0)

## 2020-12-24 ENCOUNTER — Encounter: Payer: Self-pay | Admitting: Internal Medicine

## 2020-12-24 ENCOUNTER — Other Ambulatory Visit: Payer: Self-pay

## 2020-12-24 ENCOUNTER — Ambulatory Visit (INDEPENDENT_AMBULATORY_CARE_PROVIDER_SITE_OTHER): Payer: BC Managed Care – PPO | Admitting: Internal Medicine

## 2020-12-24 VITALS — BP 120/70 | HR 72 | Temp 98.0°F | Ht 67.0 in | Wt 185.0 lb

## 2020-12-24 DIAGNOSIS — N401 Enlarged prostate with lower urinary tract symptoms: Secondary | ICD-10-CM | POA: Diagnosis not present

## 2020-12-24 DIAGNOSIS — Z0000 Encounter for general adult medical examination without abnormal findings: Secondary | ICD-10-CM | POA: Diagnosis not present

## 2020-12-24 DIAGNOSIS — Z8659 Personal history of other mental and behavioral disorders: Secondary | ICD-10-CM

## 2020-12-24 DIAGNOSIS — E7800 Pure hypercholesterolemia, unspecified: Secondary | ICD-10-CM

## 2020-12-24 DIAGNOSIS — J309 Allergic rhinitis, unspecified: Secondary | ICD-10-CM

## 2020-12-24 DIAGNOSIS — N529 Male erectile dysfunction, unspecified: Secondary | ICD-10-CM

## 2020-12-24 DIAGNOSIS — R351 Nocturia: Secondary | ICD-10-CM

## 2020-12-24 DIAGNOSIS — F341 Dysthymic disorder: Secondary | ICD-10-CM

## 2020-12-24 LAB — POCT URINALYSIS DIPSTICK
Appearance: NEGATIVE
Bilirubin, UA: NEGATIVE
Blood, UA: NEGATIVE
Glucose, UA: NEGATIVE
Ketones, UA: NEGATIVE
Leukocytes, UA: NEGATIVE
Nitrite, UA: NEGATIVE
Odor: NEGATIVE
Protein, UA: NEGATIVE
Spec Grav, UA: 1.015 (ref 1.010–1.025)
Urobilinogen, UA: 0.2 E.U./dL
pH, UA: 6 (ref 5.0–8.0)

## 2020-12-24 MED ORDER — ALPRAZOLAM 0.5 MG PO TABS: 0.5000 mg | ORAL_TABLET | Freq: Two times a day (BID) | ORAL | 1 refills | Status: DC | PRN

## 2020-12-24 MED ORDER — SERTRALINE HCL 50 MG PO TABS: 50.0000 mg | ORAL_TABLET | Freq: Every day | ORAL | 1 refills | Status: DC

## 2020-12-27 NOTE — Progress Notes (Signed)
Subjective:    Patient ID: Alfred Berry, male    DOB: 12-May-1966, 55 y.o.   MRN: [id number]  HPI 55 year old Male seen for health maintenance exam and evaluation of medical issues.  History of BPH treated by urologist with Proscar. Takes Viagra as needed for erectile dysfunction.  Has seen podiatrist for plantar fasciitis.  Had colonoscopy October 2017 with 2 polyps being removed.  One was a sessile adenoma.  Repeat study recommended in 5 years which will be October 2022.  In February 2016 he had herpes zoster affecting left scalp and face.  He saw ophthalmologist and did not have ophthalmic involvement with Herpes zoster.  He was treated with Valtrex and Norco and did well.  Takes Zocor for hyperlipidemia  History of mild obesity and borderline hypertension.  Had cosmetic surgery 1975.  In April 2018 had severe right-sided back pain with radiculopathy that subsequently resolved after physical therapy and medical treatment with steroids and muscle relaxants.  Continues to have physical therapy and massage therapy is needed at [facility].  History of allergic rhinitis.  History of mild anxiety treated with prn Xanax.  Social history: Does not smoke.  Social alcohol consumption. He is a Health and safety inspector at [employer]. Wife is an art history professor at [employer]. No children.  Family history: Father with a history of coronary artery bypass surgery and history of hypertension.  Not much known about paternal family history as father was adopted.  Mother living in good health.     Review of Systems-see above.  Prescription refilled for as needed physical therapy and massage therapy.     Objective:   Physical Exam  Blood pressure 120/70 pulse 72 regular temperature 98 degrees pulse oximetry 97% weight 185 pounds BMI 28.98  Skin: Warm and dry.  Nodes: None.  TMs are clear.  Neck is supple without JVD thyromegaly or carotid bruits.  Chest is clear to auscultation  without rales or wheezing.  Cardiac exam: Regular rate and rhythm normal S1 and S2 without murmurs or gallops.  Abdomen is soft nondistended without hepatosplenomegaly masses or tenderness.  Prostate exam deferred to urology.  No lower extremity pitting edema.  Neuro: Intact without focal deficits.  Affect thought and judgment are normal.      Assessment & Plan:  History of lumbar radiculopathy with MRI 2018 showing broad-based disc protrusion contacting the traversing left L4 nerve within the lateral recess.  At L5-S1 central disc extrusion with 7 mm of caudal subligamentous extension.  Suspected central mineralization.  Encroachment upon the traversing right S1 nerve within the lateral recess.  No canal stenosis or neural foraminal narrowing.  L4-L5 showed annular bulging and annular fissures but no canal stenosis.  Mild facet arthropathy and mild neuroforaminal narrowing.   Hyperlipidemia LDL is slightly elevated at 104 total cholesterol is normal and HDL cholesterol 56 with triglycerides of 83 on Zocor 20 mg daily.  History of erectile dysfunction treated with Cialis  History of BPH treated with Proscar by urologist  History of allergic rhinitis treated with Nasonex  History of anxiety treated with Xanax 0.5 mg sparingly twice daily as needed mainly on trips to [place]  Possible depression versus dysthymia related to current events and professional situation discussed at length.  I recommended Zoloft starting with 25 mg daily and increasing to 50 mg after a week or so.  Has Xanax if needed for anxiety.  I recommended counselor.  Follow-up here in 6 weeks for med check.  Health  maintenance-colonoscopy due later this year.  He has had 3 COVID vaccines.  Flu vaccine given today.  Tetanus immunization is up-to-date.

## 2020-12-27 NOTE — Patient Instructions (Signed)
It was a pleasure to see you today.  Begin Zoloft 25 mg daily increasing to 50 mg daily.  Counseling recommended.  Follow-up here in 6 weeks.  Flu vaccine given today.  Continue other medications as previously prescribed.  Continue diet and exercise efforts.  Colonoscopy due October 2022.  Take Xanax sparingly for anxiety as you have been doing.

## 2020-12-30 ENCOUNTER — Other Ambulatory Visit: Payer: Self-pay

## 2020-12-30 ENCOUNTER — Other Ambulatory Visit: Payer: BC Managed Care – PPO

## 2020-12-30 DIAGNOSIS — Z20822 Contact with and (suspected) exposure to covid-19: Secondary | ICD-10-CM

## 2020-12-31 LAB — SARS-COV-2, NAA 2 DAY TAT

## 2020-12-31 LAB — NOVEL CORONAVIRUS, NAA: SARS-CoV-2, NAA: NOT DETECTED

## 2021-01-23 ENCOUNTER — Other Ambulatory Visit: Payer: Self-pay | Admitting: Internal Medicine

## 2021-02-04 ENCOUNTER — Ambulatory Visit: Payer: BC Managed Care – PPO | Admitting: Internal Medicine

## 2021-02-11 ENCOUNTER — Ambulatory Visit: Payer: BC Managed Care – PPO | Admitting: Internal Medicine

## 2021-02-11 ENCOUNTER — Other Ambulatory Visit: Payer: Self-pay

## 2021-02-11 ENCOUNTER — Encounter: Payer: Self-pay | Admitting: Internal Medicine

## 2021-02-11 VITALS — BP 150/90 | HR 77 | Wt 181.0 lb

## 2021-02-11 DIAGNOSIS — Z8659 Personal history of other mental and behavioral disorders: Secondary | ICD-10-CM

## 2021-02-11 NOTE — Progress Notes (Signed)
   Subjective:    Patient ID: Alfred Berry, male    DOB: 08/14/1966, 55 y.o.   MRN: [id number]  HPI 55 year old Male seen for follow up on anxiety.  He was seen in January for health maintenance exam.  We discussed situation with current advance in his professional situation at length.  It was felt he could benefit from Zoloft.  We started him on 25 mg daily increasing to 50 mg after a week or so.  He does have Xanax as needed for anxiety.  This seems to have helped.  He is now here for follow-up.  Reports feeling calmer and able to deal with stress better.  He and his wife have decided to cancel their annual trip to [place] with pandemic due to COVID-19 pandemic.  He has a history of lumbar radiculopathy and that is currently stable.  History of hyperlipidemia treated with statin    Review of Systems see above-no issues with back pain at the present time     Objective:   Physical Exam  Blood pressure a bit elevated today at 150/90.  He will monitor this at home.  Pulse 77.  Pulse oximetry 97% weight 181 pounds.  BMI 28.35.  Affect thought and judgment are normal.  Spent 20 minutes speaking with patient about career issues, stress with pandemic, future plans.      Assessment & Plan:  Anxiety-stable with Xanax and Zoloft  History of back pain/lumbar radiculopathy-currently not an issue  Plan: We will continue with Xanax 0.5 mg twice daily as needed for anxiety.  Increase Zoloft to 100 mg daily.  I think this medication is helping him so would like to increase dose.  His physical exam is due in January 2023.  He knows he can call if he has any questions or concerns.

## 2021-02-22 ENCOUNTER — Telehealth: Payer: Self-pay | Admitting: Internal Medicine

## 2021-02-22 ENCOUNTER — Other Ambulatory Visit: Payer: Self-pay | Admitting: Internal Medicine

## 2021-02-22 MED ORDER — SERTRALINE HCL 100 MG PO TABS: 100.0000 mg | ORAL_TABLET | Freq: Every day | ORAL | 0 refills | Status: DC

## 2021-02-22 NOTE — Telephone Encounter (Signed)
Zoloft recently increased at OV from 50 to 100 mg daily. MJB,MD

## 2021-04-09 NOTE — Patient Instructions (Signed)
It was a pleasure to see you today.  Increase Zoloft to 100 mg daily and continue Xanax 0.5 mg up to twice daily as needed for anxiety.  Physical exam due in January 2023.

## 2021-05-19 ENCOUNTER — Other Ambulatory Visit (HOSPITAL_BASED_OUTPATIENT_CLINIC_OR_DEPARTMENT_OTHER): Payer: Self-pay

## 2021-05-19 ENCOUNTER — Other Ambulatory Visit: Payer: Self-pay

## 2021-05-19 ENCOUNTER — Ambulatory Visit: Payer: BC Managed Care – PPO | Attending: Internal Medicine

## 2021-05-19 DIAGNOSIS — Z23 Encounter for immunization: Secondary | ICD-10-CM

## 2021-05-19 MED ORDER — PFIZER-BIONT COVID-19 VAC-TRIS 30 MCG/0.3ML IM SUSP
INTRAMUSCULAR | 0 refills | Status: DC
  Filled 2021-05-19: qty 0.3, 1d supply, fill #0

## 2021-05-19 NOTE — Progress Notes (Signed)
   Covid-19 Vaccination Clinic  Name:  Shad Ledvina    MRN: [id number] DOB: Sep 14, 1966  05/19/2021  Mr. Poynor was observed post Covid-19 immunization for 15 minutes without incident. He was provided with Vaccine Information Sheet and instruction to access the V-Safe system.   Mr. Fudala was instructed to call 911 with any severe reactions post vaccine: Difficulty breathing  Swelling of face and throat  A fast heartbeat  A bad rash all over body  Dizziness and weakness   Immunizations Administered     Name Date Dose VIS Date Route   PFIZER Comrnaty([place] TOP) Covid-19 Vaccine 05/19/2021  1:25 PM 0.3 mL 11/18/2020 Intramuscular   Manufacturer: [place]   Lot: [id number]   [place]: [phone]

## 2021-05-30 ENCOUNTER — Other Ambulatory Visit: Payer: Self-pay | Admitting: Internal Medicine

## 2021-06-06 ENCOUNTER — Other Ambulatory Visit: Payer: Self-pay | Admitting: Internal Medicine

## 2021-06-26 ENCOUNTER — Other Ambulatory Visit: Payer: Self-pay | Admitting: Internal Medicine

## 2021-06-27 NOTE — Telephone Encounter (Signed)
Left message to call back  

## 2021-10-14 ENCOUNTER — Other Ambulatory Visit: Payer: Self-pay

## 2021-10-14 ENCOUNTER — Ambulatory Visit (INDEPENDENT_AMBULATORY_CARE_PROVIDER_SITE_OTHER): Payer: BC Managed Care – PPO | Admitting: Internal Medicine

## 2021-10-14 DIAGNOSIS — Z23 Encounter for immunization: Secondary | ICD-10-CM

## 2021-10-27 ENCOUNTER — Ambulatory Visit: Payer: BC Managed Care – PPO

## 2021-10-27 ENCOUNTER — Other Ambulatory Visit: Payer: Self-pay

## 2021-10-27 MED ORDER — SERTRALINE HCL 100 MG PO TABS: 100.0000 mg | ORAL_TABLET | Freq: Every day | ORAL | 0 refills | Status: DC

## 2021-10-27 NOTE — Telephone Encounter (Signed)
Last Appt 3/4 next appt 1/16

## 2021-11-04 ENCOUNTER — Encounter: Payer: Self-pay | Admitting: Internal Medicine

## 2021-12-23 ENCOUNTER — Other Ambulatory Visit: Payer: BC Managed Care – PPO | Admitting: Internal Medicine

## 2021-12-23 ENCOUNTER — Other Ambulatory Visit: Payer: Self-pay

## 2021-12-23 DIAGNOSIS — E7800 Pure hypercholesterolemia, unspecified: Secondary | ICD-10-CM

## 2021-12-23 DIAGNOSIS — Z0000 Encounter for general adult medical examination without abnormal findings: Secondary | ICD-10-CM

## 2021-12-23 DIAGNOSIS — N401 Enlarged prostate with lower urinary tract symptoms: Secondary | ICD-10-CM

## 2021-12-24 LAB — COMPLETE METABOLIC PANEL WITH GFR
AG Ratio: 1.8 (calc) (ref 1.0–2.5)
ALT: 18 U/L (ref 9–46)
AST: 22 U/L (ref 10–35)
Albumin: 4.6 g/dL (ref 3.6–5.1)
Alkaline phosphatase (APISO): 27 U/L — ABNORMAL LOW (ref 35–144)
BUN: 17 mg/dL (ref 7–25)
CO2: 27 mmol/L (ref 20–32)
Calcium: 9.3 mg/dL (ref 8.6–10.3)
Chloride: 103 mmol/L (ref 98–110)
Creat: 0.98 mg/dL (ref 0.70–1.30)
Globulin: 2.6 g/dL (calc) (ref 1.9–3.7)
Glucose, Bld: 94 mg/dL (ref 65–99)
Potassium: 4.4 mmol/L (ref 3.5–5.3)
Sodium: 140 mmol/L (ref 135–146)
Total Bilirubin: 0.7 mg/dL (ref 0.2–1.2)
Total Protein: 7.2 g/dL (ref 6.1–8.1)
eGFR: 91 mL/min/{1.73_m2} (ref >=60)

## 2021-12-24 LAB — CBC WITH DIFFERENTIAL/PLATELET
Absolute Monocytes: 310 cells/uL (ref 200–950)
Basophils Absolute: 40 cells/uL (ref 0–200)
Basophils Relative: 1.1 %
Eosinophils Absolute: 90 cells/uL (ref 15–500)
Eosinophils Relative: 2.5 %
HCT: 43.8 % (ref 38.5–50.0)
Hemoglobin: 14.8 g/dL (ref 13.2–17.1)
Lymphs Abs: 1174 cells/uL (ref 850–3900)
MCH: 32.6 pg (ref 27.0–33.0)
MCHC: 33.8 g/dL (ref 32.0–36.0)
MCV: 96.5 fL (ref 80.0–100.0)
MPV: 10.3 fL (ref 7.5–12.5)
Monocytes Relative: 8.6 %
Neutro Abs: 1987 cells/uL (ref 1500–7800)
Neutrophils Relative %: 55.2 %
Platelets: 231 10*3/uL (ref 140–400)
RBC: 4.54 10*6/uL (ref 4.20–5.80)
RDW: 12.4 % (ref 11.0–15.0)
Total Lymphocyte: 32.6 %
WBC: 3.6 10*3/uL — ABNORMAL LOW (ref 3.8–10.8)

## 2021-12-24 LAB — PSA: PSA: 1.46 ng/mL (ref <=4.00)

## 2021-12-24 LAB — LIPID PANEL
Cholesterol: 182 mg/dL (ref <200)
HDL: 64 mg/dL (ref >=40)
LDL Cholesterol (Calc): 104 mg/dL (calc) — ABNORMAL HIGH
Non-HDL Cholesterol (Calc): 118 mg/dL (calc) (ref <130)
Total CHOL/HDL Ratio: 2.8 (calc) (ref <5.0)
Triglycerides: 62 mg/dL (ref <150)

## 2021-12-26 ENCOUNTER — Other Ambulatory Visit: Payer: Self-pay

## 2021-12-26 ENCOUNTER — Ambulatory Visit (INDEPENDENT_AMBULATORY_CARE_PROVIDER_SITE_OTHER): Payer: BC Managed Care – PPO | Admitting: Internal Medicine

## 2021-12-26 ENCOUNTER — Encounter: Payer: Self-pay | Admitting: Internal Medicine

## 2021-12-26 ENCOUNTER — Other Ambulatory Visit: Payer: BC Managed Care – PPO | Admitting: Internal Medicine

## 2021-12-26 VITALS — BP 122/78 | HR 77 | Temp 97.8°F | Ht 68.0 in | Wt 184.0 lb

## 2021-12-26 DIAGNOSIS — E7800 Pure hypercholesterolemia, unspecified: Secondary | ICD-10-CM

## 2021-12-26 DIAGNOSIS — Z8659 Personal history of other mental and behavioral disorders: Secondary | ICD-10-CM | POA: Diagnosis not present

## 2021-12-26 DIAGNOSIS — N529 Male erectile dysfunction, unspecified: Secondary | ICD-10-CM

## 2021-12-26 DIAGNOSIS — Z87898 Personal history of other specified conditions: Secondary | ICD-10-CM

## 2021-12-26 DIAGNOSIS — M7700 Medial epicondylitis, unspecified elbow: Secondary | ICD-10-CM

## 2021-12-26 DIAGNOSIS — Z0000 Encounter for general adult medical examination without abnormal findings: Secondary | ICD-10-CM

## 2021-12-26 DIAGNOSIS — R351 Nocturia: Secondary | ICD-10-CM

## 2021-12-26 DIAGNOSIS — N401 Enlarged prostate with lower urinary tract symptoms: Secondary | ICD-10-CM | POA: Diagnosis not present

## 2021-12-26 DIAGNOSIS — J309 Allergic rhinitis, unspecified: Secondary | ICD-10-CM

## 2021-12-26 LAB — POCT URINALYSIS DIPSTICK
Bilirubin, UA: NEGATIVE
Blood, UA: NEGATIVE
Glucose, UA: NEGATIVE
Ketones, UA: NEGATIVE
Leukocytes, UA: NEGATIVE
Nitrite, UA: NEGATIVE
Protein, UA: NEGATIVE
Spec Grav, UA: 1.02 (ref 1.010–1.025)
Urobilinogen, UA: 0.2 E.U./dL
pH, UA: 5 (ref 5.0–8.0)

## 2021-12-26 MED ORDER — ALPRAZOLAM 0.5 MG PO TABS: 0.5000 mg | ORAL_TABLET | Freq: Two times a day (BID) | ORAL | 1 refills | Status: DC | PRN

## 2021-12-26 NOTE — Progress Notes (Signed)
° °  Subjective:    Patient ID: Alfred Berry, male    DOB: 10/27/1966, 56 y.o.   MRN: [id number]  HPI 56 year old Male for health maintenance exam and evaluation of medical issues.  He has a history of elevated PSA and enlarged prostate treated with finasteride.  Followed by Dr. Tresa Moore at [facility].  History of anxiety treated with Xanax and Zoloft.  History of lumbar radiculopathy that currently is stable.  History of hyperlipidemia treated with statin medication.  Has seen podiatrist for plantar fasciitis.  History of allergic rhinitis.  Had Herpes zoster affecting left scalp and face in February 2016.  He saw ophthalmologist and did not have ophthalmic involvement.  He was treated with Valtrex and did well.  Had colonoscopy in 2017 with 2 polyps being removed.  Repeat study was due October 2022 and patient will call about follow-up.  In 2018 he had severe right-sided back pain with radiculopathy that subsequently resolved after physical therapy and medical treatment with steroids and muscle relaxants.  He continues to have physical therapy and massage therapy as needed at [facility].  Social history: Does not smoke.  Social alcohol consumption.  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  No children.  Family history: Father with history of coronary artery bypass surgery and history of hypertension.  Not much known about paternal family history as his father was adopted.  Mother in good health.  Review of Systems thinks he has restless leg syndrome. Has twitching in legs once a week.  This does not sound like restless leg syndrome to me.  Has medical epicondyle tenderness (probable golfers elbow) which can be managed conservatively.  He should try ice and some anti-inflammatory medication to see if it improves.     Objective:   Physical Exam        Assessment & Plan:  Hyperlipidemia treated with Zocor 20 mg daily.   Lipid panel normal with the exception of an LDL of 104.  Continue diet and exercise efforts and continue Zocor.  Elevated PSA seen by Dr. Tresa Moore.  Treated with finasteride.  Erectile dysfunction treated with Cialis as needed  Medial epicondyles tenderness-will likely respond to ice and anti-inflammatory medication  Anxiety treated with Xanax and Zoloft  History of lumbar radiculopathy  Allergic rhinitis treated with steroid nasal spray  Plan: Continue current medications and follow-up in 1 year.

## 2021-12-30 ENCOUNTER — Encounter: Payer: BC Managed Care – PPO | Admitting: Internal Medicine

## 2022-01-09 ENCOUNTER — Other Ambulatory Visit (HOSPITAL_COMMUNITY): Payer: Self-pay

## 2022-01-09 NOTE — Patient Instructions (Addendum)
It was a pleasure to see you today.  Continue current medications and follow-up in 1 year or as needed.  Continue follow-up with urology.  Prescription written for treatment at [facility].

## 2022-01-21 ENCOUNTER — Other Ambulatory Visit: Payer: Self-pay | Admitting: Internal Medicine

## 2022-04-20 ENCOUNTER — Encounter: Payer: Self-pay | Admitting: Internal Medicine

## 2022-05-29 ENCOUNTER — Ambulatory Visit (AMBULATORY_SURGERY_CENTER): Payer: Self-pay | Admitting: *Deleted

## 2022-05-29 VITALS — Ht 68.0 in | Wt 185.2 lb

## 2022-05-29 DIAGNOSIS — Z86010 Personal history of colonic polyps: Secondary | ICD-10-CM

## 2022-05-29 MED ORDER — NA SULFATE-K SULFATE-MG SULF 17.5-3.13-1.6 GM/177ML PO SOLN: 1.0000 | Freq: Once | ORAL | 0 refills | Status: AC

## 2022-05-29 NOTE — Progress Notes (Signed)

## 2022-06-08 ENCOUNTER — Encounter: Payer: Self-pay | Admitting: Internal Medicine

## 2022-06-19 ENCOUNTER — Encounter: Payer: Self-pay | Admitting: Internal Medicine

## 2022-06-19 ENCOUNTER — Ambulatory Visit (AMBULATORY_SURGERY_CENTER): Payer: BC Managed Care – PPO | Admitting: Internal Medicine

## 2022-06-19 VITALS — BP 138/88 | HR 58 | Temp 97.5°F | Resp 14 | Ht 68.0 in | Wt 185.0 lb

## 2022-06-19 DIAGNOSIS — D122 Benign neoplasm of ascending colon: Secondary | ICD-10-CM

## 2022-06-19 DIAGNOSIS — Z86010 Personal history of colonic polyps: Secondary | ICD-10-CM | POA: Diagnosis present

## 2022-06-19 DIAGNOSIS — Z09 Encounter for follow-up examination after completed treatment for conditions other than malignant neoplasm: Secondary | ICD-10-CM

## 2022-06-19 MED ORDER — SODIUM CHLORIDE 0.9 % IV SOLN: 500.0000 mL | Freq: Once | INTRAVENOUS | Status: DC

## 2022-06-19 NOTE — Progress Notes (Signed)
Sedate, gd SR, tolerated procedure well, VSS, report to RN 

## 2022-06-19 NOTE — Op Note (Signed)
[facility] Patient Name: Alfred Berry Procedure Date: 06/19/2022 11:06 AM MRN: [id number] Endoscopist: Docia Chuck. Henrene Pastor , MD Age: 56 Referring MD:  Date of Birth: [birth year] Gender: Male Account #: [id number] Procedure:                Colonoscopy with cold snare polypectomy x1 Indications:              High risk colon cancer surveillance: Personal                            history of sessile serrated colon polyp (less than                            10 mm in size) with no dysplasia. Previous                            examination 2017 Medicines:                Monitored Anesthesia Care Procedure:                Pre-Anesthesia Assessment:                           - Prior to the procedure, a History and Physical                            was performed, and patient medications and                            allergies were reviewed. The patient's tolerance of                            previous anesthesia was also reviewed. The risks                            and benefits of the procedure and the sedation                            options and risks were discussed with the patient.                            All questions were answered, and informed consent                            was obtained. Prior Anticoagulants: The patient has                            taken no previous anticoagulant or antiplatelet                            agents. ASA Grade Assessment: II - A patient with                            mild systemic disease. After reviewing the risks  and benefits, the patient was deemed in                            satisfactory condition to undergo the procedure.                           After obtaining informed consent, the colonoscope                            was passed under direct vision. Throughout the                            procedure, the patient's blood pressure, pulse, and                            oxygen saturations were  monitored continuously. The                            Olympus CF-HQ190L [id number]) Colonoscope was                            introduced through the anus and advanced to the the                            cecum, identified by appendiceal orifice and                            ileocecal valve. The ileocecal valve, appendiceal                            orifice, and rectum were photographed. The quality                            of the bowel preparation was excellent. The                            colonoscopy was performed without difficulty. The                            patient tolerated the procedure well. The bowel                            preparation used was SUPREP via split dose                            instruction. Scope In: 11:20:03 AM Scope Out: 11:35:10 AM Scope Withdrawal Time: 0 hours 10 minutes 29 seconds  Total Procedure Duration: 0 hours 15 minutes 7 seconds  Findings:                 A 2 mm polyp was found in the ascending colon. The                            polyp was removed with a cold snare. Resection and  retrieval were complete.                           The exam was otherwise without abnormality on                            direct and retroflexion views. Complications:            No immediate complications. Estimated blood loss:                            None. Estimated Blood Loss:     Estimated blood loss: none. Impression:               - One 2 mm polyp in the ascending colon, removed                            with a cold snare. Resected and retrieved.                           - The examination was otherwise normal on direct                            and retroflexion views. Recommendation:           - Repeat colonoscopy in 7-10 years for surveillance.                           - Patient has a contact number available for                            emergencies. The signs and symptoms of potential                             delayed complications were discussed with the                            patient. Return to normal activities tomorrow.                            Written discharge instructions were provided to the                            patient.                           - Resume previous diet.                           - Continue present medications.                           - Await pathology results. Docia Chuck. Henrene Pastor, MD 06/19/2022 11:40:27 AM This report has been signed electronically.

## 2022-06-19 NOTE — Patient Instructions (Signed)
Handout provided about polyps.  Resume previous diet.  Continue present medications. Await pathology results.  Repeat colonoscopy in 7-10 years for surveillance.   YOU HAD AN ENDOSCOPIC PROCEDURE TODAY AT [facility]:   Refer to the procedure report that was given to you for any specific questions about what was found during the examination.  If the procedure report does not answer your questions, please call your gastroenterologist to clarify.  If you requested that your care partner not be given the details of your procedure findings, then the procedure report has been included in a sealed envelope for you to review at your convenience later.  YOU SHOULD EXPECT: Some feelings of bloating in the abdomen. Passage of more gas than usual.  Walking can help get rid of the air that was put into your GI tract during the procedure and reduce the bloating. If you had a lower endoscopy (such as a colonoscopy or flexible sigmoidoscopy) you may notice spotting of blood in your stool or on the toilet paper. If you underwent a bowel prep for your procedure, you may not have a normal bowel movement for a few days.  Please Note:  You might notice some irritation and congestion in your nose or some drainage.  This is from the oxygen used during your procedure.  There is no need for concern and it should clear up in a day or so.  SYMPTOMS TO REPORT IMMEDIATELY:  Following lower endoscopy (colonoscopy or flexible sigmoidoscopy):  Excessive amounts of blood in the stool  Significant tenderness or worsening of abdominal pains  Swelling of the abdomen that is new, acute  Fever of 100F or higher   For urgent or emergent issues, a gastroenterologist can be reached at any hour by calling [phone]. Do not use MyChart messaging for urgent concerns.    DIET:  We do recommend a small meal at first, but then you may proceed to your regular diet.  Drink plenty of fluids but you should avoid alcoholic  beverages for 24 hours.  ACTIVITY:  You should plan to take it easy for the rest of today and you should NOT DRIVE or use heavy machinery until tomorrow (because of the sedation medicines used during the test).    FOLLOW UP: Our staff will call the number listed on your records the next business day following your procedure.  We will call around 7:15- 8:00 am to check on you and address any questions or concerns that you may have regarding the information given to you following your procedure. If we do not reach you, we will leave a message.  If you develop any symptoms (ie: fever, flu-like symptoms, shortness of breath, cough etc.) before then, please call [phone].  If you test positive for Covid 19 in the 2 weeks post procedure, please call and report this information to [place].    If any biopsies were taken you will be contacted by phone or by letter within the next 1-3 weeks.  Please call us at [phone] if you have not heard about the biopsies in 3 weeks.    SIGNATURES/CONFIDENTIALITY: You and/or your care partner have signed paperwork which will be entered into your electronic medical record.  These signatures attest to the fact that that the information above on your After Visit Summary has been reviewed and is understood.  Full responsibility of the confidentiality of this discharge information lies with you and/or your care-partner.

## 2022-06-19 NOTE — Progress Notes (Signed)
HISTORY OF PRESENT ILLNESS:  Alfred Berry is a 56 y.o. male with a history of sessile serrated polyp on colonoscopy 2017.  Now for surveillance.  No complaints.  REVIEW OF SYSTEMS:  All non-GI ROS negative. Past Medical History:  Diagnosis Date   Allergy    SERASONAL   Hyperlipidemia    Hypertension    borderline    Past Surgical History:  Procedure Laterality Date   COLONOSCOPY     POLYPECTOMY     RECONSTRUCTION OF EYELID  at age 41    Social History Alfred Berry  reports that he has never smoked. He has been exposed to tobacco smoke. He has never used smokeless tobacco. He reports current alcohol use of about 7.0 standard drinks of alcohol per week. He reports that he does not use drugs.  family history includes Heart disease in his father.  Allergies  Allergen Reactions   Lincomycin Hcl Hives       PHYSICAL EXAMINATION: Vital signs: BP 136/75 (BP Location: Right Arm, Patient Position: Sitting, Cuff Size: Normal)   Pulse 67   Temp (!) 97.5 F (36.4 C) (Temporal)   Ht '5\' 8"'$  (1.727 m)   Wt 185 lb (83.9 kg)   SpO2 99%   BMI 28.13 kg/m  General: Well-developed, well-nourished, no acute distress HEENT: Sclerae are anicteric, conjunctiva pink. Oral mucosa intact Lungs: Clear Heart: Regular Abdomen: soft, nontender, nondistended, no obvious ascites, no peritoneal signs, normal bowel sounds. No organomegaly. Extremities: No edema Psychiatric: alert and oriented x3. Cooperative      ASSESSMENT:   Personal history of sessile serrated polyp  PLAN:   Surveillance colonoscopy

## 2022-06-19 NOTE — Progress Notes (Signed)
Called to room to assist during endoscopic procedure.  Patient ID and intended procedure confirmed with present staff. Received instructions for my participation in the procedure from the performing physician.  

## 2022-06-19 NOTE — Progress Notes (Signed)
Vitals-DT  Pt's states no medical or surgical changes since previsit or office visit.  

## 2022-06-20 ENCOUNTER — Telehealth: Payer: Self-pay

## 2022-06-20 NOTE — Telephone Encounter (Signed)
Left message on answering machine. 

## 2022-06-21 ENCOUNTER — Encounter: Payer: Self-pay | Admitting: Internal Medicine

## 2022-07-06 ENCOUNTER — Other Ambulatory Visit: Payer: Self-pay | Admitting: Internal Medicine

## 2022-12-22 ENCOUNTER — Other Ambulatory Visit: Payer: Self-pay | Admitting: Internal Medicine

## 2023-01-23 ENCOUNTER — Telehealth: Payer: Self-pay | Admitting: Internal Medicine

## 2023-01-23 NOTE — Telephone Encounter (Signed)
Tion dropped off,  Flex forms to be completed and filled out, so they can get reimbursed for Massaged therapy thru flex card.

## 2023-01-23 NOTE — Telephone Encounter (Signed)
LVM that forms are completed.

## 2023-01-26 ENCOUNTER — Other Ambulatory Visit: Payer: Self-pay | Admitting: Internal Medicine

## 2023-03-19 NOTE — Progress Notes (Signed)
Patient Care Team: Margaree Mackintosh, MD as PCP - General (Internal Medicine)  Visit Date: 03/26/23  Subjective:    Patient ID: Alfred Berry , Male   DOB: [birth year], 57 y.o.    MRN: [id number]   57 y.o. Male presents today for annual comprehensive physical exam.  His general health is excellent and he feels well.  History of hyperlipidemia treated with simvastatin 20 mg daily at bedtime. CHOL elevated at 207, LDL at 122 on 03/23/23, lipid panel otherwise normal.  History of lumbar radiculopathy. This is stable currently.  He has a longstanding history of mild anxiety treated with Xanax and  Zoloft.  Has seen podiatrist for plantars fasciitis in the remote past.  History of allergic rhinitis.  He had Herpes zoster affecting his left scalp and face in February 2016.  He saw ophthalmologist and did not have ophthalmic involvement.  He was treated with Valtrex and did well.  Labs reviewed: Glucose normal. Liver, kidney functions normal. Electrolytes normal. Alk phos low at 29 on 03/23/23. CBC normal.  Colonoscopy last completed 06/19/22. One 2 mm polyp in ascending colon, removed. Examination otherwise normal. Pathology showed tubular adenoma. Recommended repeat in 2030.  In 2018, he had severe right-sided back pain with radiculopathy that subsequently resolved after physical therapy and medical treatment with steroids and muscle relaxants.  He continues to have physical therapy and massage therapy if needed.  History of elevated PSA.  Followed at [facility].  Felt to have an enlarged prostate for his age when seen initially by urologist in 2017.  Improved symptoms with finasteride.  Past Medical History:  Diagnosis Date   Allergy    SERASONAL   Hyperlipidemia    Hypertension    borderline     Family History  Problem Relation Age of Onset   Heart disease Father    Colon cancer Neg Hx    Esophageal cancer Neg Hx    Rectal cancer Neg Hx    Stomach cancer Neg Hx    Colon  polyps Neg Hx    Crohn's disease Neg Hx     Social History   Social History Narrative       Social history: Does not smoke.  Social alcohol consumption. He is a Community education officer at [employer]. Wife is an art history professor at [employer]. No children.       Family history: Father with a history of coronary artery bypass surgery and history of hypertension.  Not much known about paternal family history as father was adopted.  Mother living in good health.              Review of Systems  Constitutional:  Negative for chills, fever, malaise/fatigue and weight loss.  HENT:  Negative for hearing loss, sinus pain and sore throat.   Respiratory:  Negative for cough, hemoptysis and shortness of breath.   Cardiovascular:  Negative for chest pain, palpitations, leg swelling and PND.  Gastrointestinal:  Negative for abdominal pain, constipation, diarrhea, heartburn, nausea and vomiting.  Genitourinary:  Negative for dysuria, frequency and urgency.  Musculoskeletal:  Negative for back pain, myalgias and neck pain.  Skin:  Negative for itching and rash.  Neurological:  Negative for dizziness, tingling, seizures and headaches.  Endo/Heme/Allergies:  Negative for polydipsia.  Psychiatric/Behavioral:  Negative for depression. The patient is not nervous/anxious.         Objective:   Vitals: BP (!) 124/90 (BP Location: Right Arm)   Pulse 75   Temp  98.5 F (36.9 C) (Tympanic)   Ht 5\' 8"  (1.727 m)   Wt 188 lb 6.4 oz (85.5 kg)   SpO2 97%   BMI 28.65 kg/m    Physical Exam Vitals and nursing note reviewed.  Constitutional:      General: He is awake. He is not in acute distress.    Appearance: Normal appearance. He is not ill-appearing or toxic-appearing.  HENT:     Head: Normocephalic and atraumatic.     Right Ear: Tympanic membrane, ear canal and external ear normal.     Left Ear: Tympanic membrane, ear canal and external ear normal.     Mouth/Throat:     Pharynx: Oropharynx is  clear.  Eyes:     Extraocular Movements: Extraocular movements intact.     Pupils: Pupils are equal, round, and reactive to light.  Neck:     Thyroid: No thyroid mass, thyromegaly or thyroid tenderness.     Vascular: No carotid bruit.  Cardiovascular:     Rate and Rhythm: Normal rate and regular rhythm. No extrasystoles are present.    Pulses:          Dorsalis pedis pulses are 1+ on the right side and 1+ on the left side.     Heart sounds: Normal heart sounds. No murmur heard.    No friction rub. No gallop.  Pulmonary:     Effort: Pulmonary effort is normal.     Breath sounds: Normal breath sounds. No decreased breath sounds, wheezing, rhonchi or rales.  Chest:     Chest wall: No mass.  Abdominal:     Palpations: Abdomen is soft.     Tenderness: There is no abdominal tenderness.     Hernia: No hernia is present.  Genitourinary:    Prostate: Normal. Not enlarged and no nodules present.     Comments: Prostate smooth and symmetrical upon examination. Musculoskeletal:     Cervical back: Normal range of motion.     Right knee: No crepitus.     Left knee: No crepitus.     Right lower leg: No edema.     Left lower leg: No edema.  Lymphadenopathy:     Cervical: No cervical adenopathy.     Upper Body:     Right upper body: No supraclavicular adenopathy.     Left upper body: No supraclavicular adenopathy.  Skin:    General: Skin is warm and dry.  Neurological:     General: No focal deficit present.     Mental Status: He is alert and oriented to person, place, and time. Mental status is at baseline.     Cranial Nerves: Cranial nerves 2-12 are intact.     Sensory: Sensation is intact.     Motor: Motor function is intact.     Coordination: Coordination is intact.     Gait: Gait is intact.     Deep Tendon Reflexes: Reflexes are normal and symmetric.  Psychiatric:        Attention and Perception: Attention normal.        Mood and Affect: Mood normal.        Speech: Speech normal.         Behavior: Behavior normal. Behavior is cooperative.        Thought Content: Thought content normal.        Cognition and Memory: Cognition and memory normal.        Judgment: Judgment normal.       Results:   Studies  obtained and personally reviewed by me:  Colonoscopy last completed 06/19/22. One 2 mm polyp in ascending colon, removed. Examination otherwise normal. Pathology showed tubular adenoma. Recommended repeat in 2030.   Labs:       Component Value Date/Time   NA 140 03/23/2023 0929   K 4.6 03/23/2023 0929   CL 105 03/23/2023 0929   CO2 26 03/23/2023 0929   GLUCOSE 86 03/23/2023 0929   BUN 14 03/23/2023 0929   CREATININE 0.87 03/23/2023 0929   CALCIUM 9.2 03/23/2023 0929   PROT 7.3 03/23/2023 0929   ALBUMIN 4.5 07/13/2016 1050   AST 18 03/23/2023 0929   ALT 17 03/23/2023 0929   ALKPHOS 29 (L) 07/13/2016 1050   BILITOT 0.4 03/23/2023 0929   GFRNONAA 85 12/20/2020 1006   GFRAA 98 12/20/2020 1006     Lab Results  Component Value Date   WBC 4.3 03/23/2023   HGB 13.9 03/23/2023   HCT 41.0 03/23/2023   MCV 96.0 03/23/2023   PLT 230 03/23/2023    Lab Results  Component Value Date   CHOL 207 (H) 03/23/2023   HDL 68 03/23/2023   LDLCALC 122 (H) 03/23/2023   TRIG 73 03/23/2023   CHOLHDL 3.0 03/23/2023    Lab Results  Component Value Date   HGBA1C 5.7 (H) 03/23/2023     No results found for: "TSH"   Lab Results  Component Value Date   PSA 1.70 03/23/2023   PSA 1.46 12/23/2021   PSA 1.34 12/20/2020      Assessment & Plan:   Anxiety and depression: stable with alprazolam 0.5 mg twice daily as needed, sertraline 100 mg daily.  Hyperlipidemia: treated with simvastatin 20 mg daily at bedtime. CHOL elevated at 207, LDL at 122 on 03/23/23, lipid panel otherwise normal.  Enlarged prostate (BPH) treated with finasteride and followed by Dr. Berneice Heinrich.  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  Prescribed  Meloxicam 15 mg once daily as needed for musculoskeletal pain, Medrol dosepak tapering course take as directed for acute severe low back pain, Norco 10-325 mg every 8 hours as needed, Flexeril 10 mg three times daily as needed for back spasms.  Vaccine counseling: will be getting first Shingrix vaccine at pharmacy before leaving for [place] trip. UTD on flu, tetanus vaccines.  Consider Pneumococcal 20 vaccine before traveling.  Plan: He has some very slight glucose intolerance with hemoglobin A1c 5.7%.  He will work on diet and exercise and we can follow-up with this again next year.  He has elevated LDL at 122 and a year ago it was 104.  Continue to watch diet and exercise.  He should take simvastatin 20 mg daily.    I,Alexander Ruley,acting as a Neurosurgeon for Margaree Mackintosh, MD.,have documented all relevant documentation on the behalf of Margaree Mackintosh, MD,as directed by  Margaree Mackintosh, MD while in the presence of Margaree Mackintosh, MD.   I, Margaree Mackintosh, MD, have reviewed all documentation for this visit. The documentation on 04/08/23 for the exam, diagnosis, procedures, and orders are all accurate and complete.

## 2023-03-23 ENCOUNTER — Other Ambulatory Visit: Payer: BC Managed Care – PPO

## 2023-03-23 DIAGNOSIS — Z87898 Personal history of other specified conditions: Secondary | ICD-10-CM

## 2023-03-23 DIAGNOSIS — Z0000 Encounter for general adult medical examination without abnormal findings: Secondary | ICD-10-CM

## 2023-03-23 DIAGNOSIS — E7800 Pure hypercholesterolemia, unspecified: Secondary | ICD-10-CM

## 2023-03-23 DIAGNOSIS — Z125 Encounter for screening for malignant neoplasm of prostate: Secondary | ICD-10-CM

## 2023-03-23 DIAGNOSIS — Z8659 Personal history of other mental and behavioral disorders: Secondary | ICD-10-CM

## 2023-03-23 DIAGNOSIS — Z8639 Personal history of other endocrine, nutritional and metabolic disease: Secondary | ICD-10-CM

## 2023-03-24 LAB — CBC WITH DIFFERENTIAL/PLATELET
Absolute Monocytes: 314 cells/uL (ref 200–950)
Basophils Absolute: 39 cells/uL (ref 0–200)
Basophils Relative: 0.9 %
Eosinophils Absolute: 181 cells/uL (ref 15–500)
Eosinophils Relative: 4.2 %
HCT: 41 % (ref 38.5–50.0)
Hemoglobin: 13.9 g/dL (ref 13.2–17.1)
Lymphs Abs: 1372 cells/uL (ref 850–3900)
MCH: 32.6 pg (ref 27.0–33.0)
MCHC: 33.9 g/dL (ref 32.0–36.0)
MCV: 96 fL (ref 80.0–100.0)
MPV: 10.2 fL (ref 7.5–12.5)
Monocytes Relative: 7.3 %
Neutro Abs: 2395 cells/uL (ref 1500–7800)
Neutrophils Relative %: 55.7 %
Platelets: 230 10*3/uL (ref 140–400)
RBC: 4.27 10*6/uL (ref 4.20–5.80)
RDW: 12.6 % (ref 11.0–15.0)
Total Lymphocyte: 31.9 %
WBC: 4.3 10*3/uL (ref 3.8–10.8)

## 2023-03-24 LAB — LIPID PANEL
Cholesterol: 207 mg/dL — ABNORMAL HIGH (ref <200)
HDL: 68 mg/dL (ref >=40)
LDL Cholesterol (Calc): 122 mg/dL (calc) — ABNORMAL HIGH
Non-HDL Cholesterol (Calc): 139 mg/dL (calc) — ABNORMAL HIGH (ref <130)
Total CHOL/HDL Ratio: 3 (calc) (ref <5.0)
Triglycerides: 73 mg/dL (ref <150)

## 2023-03-24 LAB — COMPLETE METABOLIC PANEL WITH GFR
AG Ratio: 1.7 (calc) (ref 1.0–2.5)
ALT: 17 U/L (ref 9–46)
AST: 18 U/L (ref 10–35)
Albumin: 4.6 g/dL (ref 3.6–5.1)
Alkaline phosphatase (APISO): 29 U/L — ABNORMAL LOW (ref 35–144)
BUN: 14 mg/dL (ref 7–25)
CO2: 26 mmol/L (ref 20–32)
Calcium: 9.2 mg/dL (ref 8.6–10.3)
Chloride: 105 mmol/L (ref 98–110)
Creat: 0.87 mg/dL (ref 0.70–1.30)
Globulin: 2.7 g/dL (calc) (ref 1.9–3.7)
Glucose, Bld: 86 mg/dL (ref 65–99)
Potassium: 4.6 mmol/L (ref 3.5–5.3)
Sodium: 140 mmol/L (ref 135–146)
Total Bilirubin: 0.4 mg/dL (ref 0.2–1.2)
Total Protein: 7.3 g/dL (ref 6.1–8.1)
eGFR: 101 mL/min/{1.73_m2} (ref >=60)

## 2023-03-24 LAB — PSA: PSA: 1.7 ng/mL (ref <=4.00)

## 2023-03-24 LAB — HEMOGLOBIN A1C
Hgb A1c MFr Bld: 5.7 % of total Hgb — ABNORMAL HIGH (ref <5.7)
Mean Plasma Glucose: 117 mg/dL
eAG (mmol/L): 6.5 mmol/L

## 2023-03-26 ENCOUNTER — Encounter: Payer: Self-pay | Admitting: Internal Medicine

## 2023-03-26 ENCOUNTER — Ambulatory Visit (INDEPENDENT_AMBULATORY_CARE_PROVIDER_SITE_OTHER): Payer: BC Managed Care – PPO | Admitting: Internal Medicine

## 2023-03-26 VITALS — BP 124/90 | HR 75 | Temp 98.5°F | Ht 68.0 in | Wt 188.4 lb

## 2023-03-26 DIAGNOSIS — Z8659 Personal history of other mental and behavioral disorders: Secondary | ICD-10-CM

## 2023-03-26 DIAGNOSIS — J309 Allergic rhinitis, unspecified: Secondary | ICD-10-CM

## 2023-03-26 DIAGNOSIS — E7800 Pure hypercholesterolemia, unspecified: Secondary | ICD-10-CM | POA: Diagnosis not present

## 2023-03-26 DIAGNOSIS — Z7184 Encounter for health counseling related to travel: Secondary | ICD-10-CM

## 2023-03-26 DIAGNOSIS — Z0000 Encounter for general adult medical examination without abnormal findings: Secondary | ICD-10-CM

## 2023-03-26 DIAGNOSIS — N400 Enlarged prostate without lower urinary tract symptoms: Secondary | ICD-10-CM | POA: Diagnosis not present

## 2023-03-26 LAB — POCT URINALYSIS DIPSTICK
Bilirubin, UA: NEGATIVE
Blood, UA: NEGATIVE
Glucose, UA: NEGATIVE
Ketones, UA: NEGATIVE
Leukocytes, UA: NEGATIVE
Nitrite, UA: NEGATIVE
Protein, UA: NEGATIVE
Spec Grav, UA: 1.01 (ref 1.010–1.025)
Urobilinogen, UA: 0.2 E.U./dL
pH, UA: 6 (ref 5.0–8.0)

## 2023-03-26 MED ORDER — MELOXICAM 15 MG PO TABS: 15.0000 mg | ORAL_TABLET | Freq: Every day | ORAL | 0 refills | Status: AC

## 2023-03-26 MED ORDER — METHYLPREDNISOLONE 4 MG PO TABS: ORAL_TABLET | ORAL | 0 refills | Status: DC

## 2023-03-26 MED ORDER — HYDROCODONE-ACETAMINOPHEN 10-325 MG PO TABS: 1.0000 | ORAL_TABLET | Freq: Three times a day (TID) | ORAL | 0 refills | Status: AC | PRN

## 2023-03-26 MED ORDER — CYCLOBENZAPRINE HCL 10 MG PO TABS: 10.0000 mg | ORAL_TABLET | Freq: Three times a day (TID) | ORAL | 0 refills | Status: DC | PRN

## 2023-04-08 DIAGNOSIS — N400 Enlarged prostate without lower urinary tract symptoms: Secondary | ICD-10-CM | POA: Insufficient documentation

## 2023-04-08 DIAGNOSIS — Z8659 Personal history of other mental and behavioral disorders: Secondary | ICD-10-CM | POA: Insufficient documentation

## 2023-04-08 NOTE — Patient Instructions (Addendum)
It was a pleasure to see you today.  Labs are stable.  Continue annual follow-up with [facility], Dr. Berneice Heinrich.  Travel advice given for upcoming trip to [place] and prescriptions filled.  Consider pneumococcal 20 vaccine before traveling.  Will be getting first Shingrix vaccine before leaving for [place].  Watch diet.  Hemoglobin A1c is very slightly elevated at 5.7%.  Continue simvastatin 20 mg daily.

## 2023-04-19 ENCOUNTER — Other Ambulatory Visit: Payer: Self-pay | Admitting: Internal Medicine

## 2023-07-03 ENCOUNTER — Encounter: Payer: Self-pay | Admitting: Nurse Practitioner

## 2023-07-03 ENCOUNTER — Ambulatory Visit: Payer: BC Managed Care – PPO | Admitting: Nurse Practitioner

## 2023-07-03 VITALS — BP 124/70 | HR 82 | Ht 68.0 in | Wt 189.0 lb

## 2023-07-03 DIAGNOSIS — R0683 Snoring: Secondary | ICD-10-CM

## 2023-07-03 DIAGNOSIS — G479 Sleep disorder, unspecified: Secondary | ICD-10-CM | POA: Diagnosis not present

## 2023-07-03 DIAGNOSIS — G2581 Restless legs syndrome: Secondary | ICD-10-CM

## 2023-07-03 NOTE — Patient Instructions (Signed)
Given your symptoms, I am concerned that you may have sleep disordered breathing with sleep apnea. You will need a sleep study for further evaluation. Someone will contact you to schedule this.   We discussed how untreated sleep apnea puts an individual at risk for cardiac arrhthymias, pulm HTN, DM, stroke and increases their risk for daytime accidents. We also briefly reviewed treatment options including weight loss, side sleeping position, oral appliance, CPAP therapy or referral to ENT for possible surgical options  Use caution when driving and pull over if you become sleepy.  Follow up in 6 weeks with Katie Cobb,NP to go over sleep study results, or sooner, if needed   

## 2023-07-03 NOTE — Assessment & Plan Note (Signed)
He has snoring, disruptive sleep, restless leg. BMI 28. Given this,  I am concerned he could have sleep disordered breathing with obstructive sleep apnea. He will need sleep study for further evaluation.    - discussed how weight can impact sleep and risk for sleep disordered breathing - discussed options to assist with weight loss: combination of diet modification, cardiovascular and strength training exercises   - had an extensive discussion regarding the adverse health consequences related to untreated sleep disordered breathing - specifically discussed the risks for hypertension, coronary artery disease, cardiac dysrhythmias, cerebrovascular disease, and diabetes - lifestyle modification discussed   - discussed how sleep disruption can increase risk of accidents, particularly when driving - safe driving practices were discussed  Patient Instructions  Given your symptoms, I am concerned that you may have sleep disordered breathing with sleep apnea. You will need a sleep study for further evaluation. Someone will contact you to schedule this.   We discussed how untreated sleep apnea puts an individual at risk for cardiac arrhthymias, pulm HTN, DM, stroke and increases their risk for daytime accidents. We also briefly reviewed treatment options including weight loss, side sleeping position, oral appliance, CPAP therapy or referral to ENT for possible surgical options  Use caution when driving and pull over if you become sleepy.  Follow up in 6 weeks with Katie Daizee Firmin,NP to go over sleep study results, or sooner, if needed

## 2023-07-03 NOTE — Progress Notes (Signed)
@Patient  ID: Alfred Berry, male    DOB: [birth year], 57 y.o.   MRN: [id number]  Chief Complaint  Patient presents with   Consult    Snoring, no daytime sleepiness, no other symptoms     Referring provider: Margaree Mackintosh, MD  HPI: 57 year old male, never smoker referred for sleep consult. Past medical history significant for BPH, allergic rhinitis, anxiety, HLD.  TEST/EVENTS:   07/03/2023: Today - sleep consult Patient presents today for sleep consult, referred by Dr. Lenord Fellers. Wakes up often throughout the night. Has loud snoring. Daytime fatigue depends on how he slept the night before. If he does get tired, tends to be later in the day. He has been told that he has teeth grinding and was recently provided with a mouth guard for this. Does have some restless leg. No morning headaches, dry mouth, drowsy driving, sleep parasomnias/paralysis. No history of narcolepsy or cataplexy.  He goes to bed between 10-11 pm. Falls asleep within 15-20 minutes. Wakes 4-5 times a night. Gets up between 6:30-7:30 am. He will occasionally take a xanax to help him fall asleep but tries to avoid this because his snoring worsens. 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 Never had a previous sleep study. No O2 use. No history of cardiac disease, stroke, DM. Never smoker. 1-2 glass of wine a day. During the school year, drinks about 4-6 cups of caffeinated coffee a day. Lives with his wife. Works as a Airline pilot at [employer]. No significant family history reported.  Epworth 2  Allergies  Allergen Reactions   Lincomycin Hcl Hives    Immunization History  Administered Date(s) Administered   Influenza Split 11/09/2011   Influenza,inj,Quad PF,6+ Mos 12/24/2015, 09/15/2016, 10/18/2018, 09/23/2019, 10/01/2020, 10/14/2021   Influenza-Unspecified 09/11/2017   PFIZER Comirnaty(Gray Top)Covid-19 Tri-Sucrose Vaccine 05/19/2021   PFIZER(Purple Top)SARS-COV-2 Vaccination 02/12/2020,  03/03/2020, 10/29/2020   Tdap 09/19/2009, 12/02/2018    Past Medical History:  Diagnosis Date   Allergy    SERASONAL   Hyperlipidemia    Hypertension    borderline    Tobacco History: Social History   Tobacco Use  Smoking Status Never   Passive exposure: Past  Smokeless Tobacco Never   Counseling given: Not Answered   Outpatient Medications Prior to Visit  Medication Sig Dispense Refill   ALPRAZolam (XANAX) 0.5 MG tablet TAKE 1 TABLET BY MOUTH TWO TIMES A DAY AS NEEDED (Patient taking differently: as needed.) 60 tablet 1   D 1000 25 MCG (1000 UT) capsule Take 1,000 Units by mouth once a week.     finasteride (PROSCAR) 5 MG tablet Take 5 mg by mouth daily.     mometasone (NASONEX) 50 MCG/ACT nasal spray SPRAY TWO SPRAYS IN EACH NOSTRIL ONCE DAILY 17 g PRN   Multiple Vitamins-Minerals (MULTIVITAMIN WITH MINERALS) tablet Take 1 tablet by mouth daily.     sertraline (ZOLOFT) 100 MG tablet TAKE 1 TABLET BY MOUTH DAILY 90 tablet 3   simvastatin (ZOCOR) 20 MG tablet TAKE 1 TABLET BY MOUTH EVERY NIGHT AT BEDTIME 90 tablet 3   cyclobenzaprine (FLEXERIL) 10 MG tablet Take 1 tablet (10 mg total) by mouth 3 (three) times daily as needed for muscle spasms. 30 tablet 0   meloxicam (MOBIC) 15 MG tablet Take 1 tablet (15 mg total) by mouth daily. 30 tablet 0   methylPREDNISolone (MEDROL) 4 MG tablet Take in tapering course as directed 6-5-4-3-2-1 21 tablet 0   tadalafil (CIALIS) 20 MG  tablet Please specify directions, refills and quantity (Patient not taking: Reported on 03/26/2023) 10 tablet 0   No facility-administered medications prior to visit.     Review of Systems:   Constitutional: No night sweats, fevers, chills, or lassitude. +occasional fatigue, weight gain  HEENT: No headaches, difficulty swallowing, tooth/dental problems, or sore throat. No itching, ear ache. +occasional allergy symptoms CV:  No chest pain, orthopnea, PND, swelling in lower extremities, anasarca, dizziness,  palpitations, syncope Resp: +snoring. No shortness of breath with exertion or at rest. No excess mucus or change in color of mucus. No productive or non-productive. No hemoptysis. No wheezing.  No chest wall deformity GI:  No heartburn, indigestion GU: No nocturia Skin: No rash, lesions, ulcerations MSK:  No joint pain or swelling.   Neuro: No dizziness or lightheadedness. +restless leg Psych: No depression or anxiety. Mood stable. +sleep disturbance     Physical Exam:  BP 124/70   Pulse 82   Ht 5\' 8"  (1.727 m)   Wt 189 lb (85.7 kg)   SpO2 98%   BMI 28.74 kg/m   GEN: Pleasant, interactive, well-appearing; in no acute distress. HEENT:  Normocephalic and atraumatic. PERRLA. Sclera white. Nasal turbinates pink, moist and patent bilaterally. No rhinorrhea present. Oropharynx pink and moist, without exudate or edema. No lesions, ulcerations, or postnasal drip. Mallampati III NECK:  Supple w/ fair ROM. No JVD present. Normal carotid impulses w/o bruits. Thyroid symmetrical with no goiter or nodules palpated. No lymphadenopathy.   CV: RRR, no m/r/g, no peripheral edema. Pulses intact, +2 bilaterally. No cyanosis, pallor or clubbing. PULMONARY:  Unlabored, regular breathing. Clear bilaterally A&P w/o wheezes/rales/rhonchi. No accessory muscle use.  GI: BS present and normoactive. Soft, non-tender to palpation. No organomegaly or masses detected.  MSK: No erythema, warmth or tenderness. Cap refil <2 sec all extrem. No deformities or joint swelling noted.  Neuro: A/Ox3. No focal deficits noted.   Skin: Warm, no lesions or rashe Psych: Normal affect and behavior. Judgement and thought content appropriate.     Lab Results:  CBC    Component Value Date/Time   WBC 4.3 03/23/2023 0929   RBC 4.27 03/23/2023 0929   HGB 13.9 03/23/2023 0929   HCT 41.0 03/23/2023 0929   PLT 230 03/23/2023 0929   MCV 96.0 03/23/2023 0929   MCH 32.6 03/23/2023 0929   MCHC 33.9 03/23/2023 0929   RDW 12.6  03/23/2023 0929   LYMPHSABS 1,372 03/23/2023 0929   MONOABS 312 07/13/2016 1050   EOSABS 181 03/23/2023 0929   BASOSABS 39 03/23/2023 0929    BMET    Component Value Date/Time   NA 140 03/23/2023 0929   K 4.6 03/23/2023 0929   CL 105 03/23/2023 0929   CO2 26 03/23/2023 0929   GLUCOSE 86 03/23/2023 0929   BUN 14 03/23/2023 0929   CREATININE 0.87 03/23/2023 0929   CALCIUM 9.2 03/23/2023 0929   GFRNONAA 85 12/20/2020 1006   GFRAA 98 12/20/2020 1006    BNP No results found for: "BNP"   Imaging:  No results found.  Administration History     None           No data to display          No results found for: "NITRICOXIDE"      Assessment & Plan:   Loud snoring He has snoring, disruptive sleep, restless leg. BMI 28. Given this,  I am concerned he could have sleep disordered breathing with obstructive sleep apnea. He will need  sleep study for further evaluation.    - discussed how weight can impact sleep and risk for sleep disordered breathing - discussed options to assist with weight loss: combination of diet modification, cardiovascular and strength training exercises   - had an extensive discussion regarding the adverse health consequences related to untreated sleep disordered breathing - specifically discussed the risks for hypertension, coronary artery disease, cardiac dysrhythmias, cerebrovascular disease, and diabetes - lifestyle modification discussed   - discussed how sleep disruption can increase risk of accidents, particularly when driving - safe driving practices were discussed  Patient Instructions  Given your symptoms, I am concerned that you may have sleep disordered breathing with sleep apnea. You will need a sleep study for further evaluation. Someone will contact you to schedule this.   We discussed how untreated sleep apnea puts an individual at risk for cardiac arrhthymias, pulm HTN, DM, stroke and increases their risk for daytime  accidents. We also briefly reviewed treatment options including weight loss, side sleeping position, oral appliance, CPAP therapy or referral to ENT for possible surgical options  Use caution when driving and pull over if you become sleepy.  Follow up in 6 weeks with Katie Memphis Decoteau,NP to go over sleep study results, or sooner, if needed     Restless leg syndrome Possibly due to untreated OSA. Will await sleep study results. May consider iron studies in the future if no improvement    I spent 35 minutes of dedicated to the care of this patient on the date of this encounter to include pre-visit review of records, face-to-face time with the patient discussing conditions above, post visit ordering of testing, clinical documentation with the electronic health record, making appropriate referrals as documented, and communicating necessary findings to members of the patients care team.  Noemi Chapel, NP 07/03/2023  Pt aware and understands NP's role.

## 2023-07-03 NOTE — Assessment & Plan Note (Signed)
Possibly due to untreated OSA. Will await sleep study results. May consider iron studies in the future if no improvement

## 2023-08-15 ENCOUNTER — Telehealth: Payer: Self-pay

## 2023-08-16 ENCOUNTER — Ambulatory Visit: Payer: BC Managed Care – PPO | Admitting: Nurse Practitioner

## 2023-08-23 DIAGNOSIS — R0683 Snoring: Secondary | ICD-10-CM

## 2023-08-23 DIAGNOSIS — G479 Sleep disorder, unspecified: Secondary | ICD-10-CM

## 2023-08-23 DIAGNOSIS — G4733 Obstructive sleep apnea (adult) (pediatric): Secondary | ICD-10-CM | POA: Diagnosis not present

## 2023-08-29 ENCOUNTER — Ambulatory Visit: Payer: BC Managed Care – PPO | Admitting: Nurse Practitioner

## 2023-09-13 ENCOUNTER — Other Ambulatory Visit: Payer: Self-pay

## 2023-09-13 NOTE — Telephone Encounter (Signed)
Received rx refill request from pharmacy. Last refill 07/06/22 17g

## 2023-09-14 MED ORDER — MOMETASONE FUROATE 50 MCG/ACT NA SUSP: NASAL | 99 refills | Status: AC

## 2023-10-04 ENCOUNTER — Ambulatory Visit: Payer: BC Managed Care – PPO | Admitting: Nurse Practitioner

## 2023-10-12 ENCOUNTER — Encounter: Payer: Self-pay | Admitting: Internal Medicine

## 2023-10-18 ENCOUNTER — Other Ambulatory Visit: Payer: Self-pay

## 2023-10-18 ENCOUNTER — Encounter: Payer: Self-pay | Admitting: Nurse Practitioner

## 2023-10-18 ENCOUNTER — Ambulatory Visit: Payer: BC Managed Care – PPO | Admitting: Nurse Practitioner

## 2023-10-18 VITALS — BP 124/64 | HR 80 | Temp 98.8°F | Ht 69.0 in | Wt 190.2 lb

## 2023-10-18 DIAGNOSIS — G4733 Obstructive sleep apnea (adult) (pediatric): Secondary | ICD-10-CM | POA: Diagnosis not present

## 2023-10-18 HISTORY — DX: Obstructive sleep apnea (adult) (pediatric): G47.33

## 2023-10-18 MED ORDER — ALPRAZOLAM 0.5 MG PO TABS: ORAL_TABLET | ORAL | 1 refills | Status: DC

## 2023-10-18 NOTE — Progress Notes (Signed)
@Patient  ID: Alfred Berry, male    DOB: 13-Sep-1966, 57 y.o.   MRN: [id number]  Chief Complaint  Patient presents with   Follow-up    Review HST  08/07/2023    Referring provider: Margaree Mackintosh, MD  HPI: 57 year old male, never smoker followed for sleep apnea. Past medical history significant for BPH, allergic rhinitis, anxiety, HLD.  TEST/EVENTS:  08/07/2023 HST: AHI 17.2/h, SpO2 low 75%  07/03/2023: OV with Joyanna Kleman NP for sleep consult, referred by Dr. Lenord Fellers. Wakes up often throughout the night. Has loud snoring. Daytime fatigue depends on how he slept the night before. If he does get tired, tends to be later in the day. He has been told that he has teeth grinding and was recently provided with a mouth guard for this. Does have some restless leg. No morning headaches, dry mouth, drowsy driving, sleep parasomnias/paralysis. No history of narcolepsy or cataplexy.  He goes to bed between 10-11 pm. Falls asleep within 15-20 minutes. Wakes 4-5 times a night. Gets up between 6:30-7:30 am. He will occasionally take a xanax to help him fall asleep but tries to avoid this because his snoring worsens. 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 Never had a previous sleep study. No O2 use. No history of cardiac disease, stroke, DM. Never smoker. 1-2 glass of wine a day. During the school year, drinks about 4-6 cups of caffeinated coffee a day. Lives with his wife. Works as a Airline pilot at [employer]. No significant family history reported. Epworth 2  10/18/2023: Today - follow up Patient presents today for follow-up to discuss sleep study results which revealed moderate sleep apnea.  He feels unchanged compared to our last visit.  Feels like he does not sleep well at night.  Has some daytime fatigue symptoms but not every day.  Does have loud snoring.  No issues with drowsy driving, sleep parasomnia/paralysis.  Wants to discuss treatment options.  Allergies  Allergen Reactions    Lincomycin Hcl Hives    Immunization History  Administered Date(s) Administered   Influenza Split 11/09/2011   Influenza,inj,Quad PF,6+ Mos 12/24/2015, 09/15/2016, 10/18/2018, 09/23/2019, 10/01/2020, 10/14/2021   Influenza-Unspecified 09/11/2017   PFIZER Comirnaty(Gray Top)Covid-19 Tri-Sucrose Vaccine 05/19/2021   PFIZER(Purple Top)SARS-COV-2 Vaccination 02/12/2020, 03/03/2020, 10/29/2020   Tdap 09/19/2009, 12/02/2018    Past Medical History:  Diagnosis Date   Allergy    SERASONAL   Hyperlipidemia    Hypertension    borderline   Moderate obstructive sleep apnea 10/18/2023    Tobacco History: Social History   Tobacco Use  Smoking Status Never   Passive exposure: Past  Smokeless Tobacco Never   Counseling given: Not Answered   Outpatient Medications Prior to Visit  Medication Sig Dispense Refill   ALPRAZolam (XANAX) 0.5 MG tablet TAKE 1 TABLET BY MOUTH TWO TIMES A DAY AS NEEDED (Patient taking differently: as needed.) 60 tablet 1   D 1000 25 MCG (1000 UT) capsule Take 1,000 Units by mouth once a week.     finasteride (PROSCAR) 5 MG tablet Take 5 mg by mouth daily.     meloxicam (MOBIC) 15 MG tablet Take 1 tablet (15 mg total) by mouth daily. 30 tablet 0   mometasone (NASONEX) 50 MCG/ACT nasal spray SPRAY TWO SPRAYS IN EACH NOSTRIL ONCE DAILY 17 g PRN   Multiple Vitamins-Minerals (MULTIVITAMIN WITH MINERALS) tablet Take 1 tablet by mouth daily.     sertraline (ZOLOFT) 100 MG tablet TAKE 1 TABLET  BY MOUTH DAILY 90 tablet 3   simvastatin (ZOCOR) 20 MG tablet TAKE 1 TABLET BY MOUTH EVERY NIGHT AT BEDTIME 90 tablet 3   tadalafil (CIALIS) 20 MG tablet Please specify directions, refills and quantity 10 tablet 0   cyclobenzaprine (FLEXERIL) 10 MG tablet Take 1 tablet (10 mg total) by mouth 3 (three) times daily as needed for muscle spasms. (Patient not taking: Reported on 10/18/2023) 30 tablet 0   methylPREDNISolone (MEDROL) 4 MG tablet Take in tapering course as directed  6-5-4-3-2-1 (Patient not taking: Reported on 10/18/2023) 21 tablet 0   No facility-administered medications prior to visit.     Review of Systems:   Constitutional: No night sweats, fevers, chills, or lassitude. +occasional fatigue, weight gain  HEENT: No headaches, difficulty swallowing, tooth/dental problems, or sore throat. No itching, ear ache. +occasional allergy symptoms CV:  No chest pain, orthopnea, PND, swelling in lower extremities, anasarca, dizziness, palpitations, syncope Resp: +snoring. No shortness of breath with exertion or at rest. No excess mucus or change in color of mucus. No productive or non-productive. No hemoptysis. No wheezing.  No chest wall deformity GI:  No heartburn, indigestion GU: No nocturia Skin: No rash, lesions, ulcerations MSK:  No joint pain or swelling.   Neuro: No dizziness or lightheadedness. +restless leg Psych: No depression or anxiety. Mood stable. +sleep disturbance     Physical Exam:  BP 124/64 (BP Location: Left Arm, Patient Position: Sitting, Cuff Size: Large)   Pulse 80   Temp 98.8 F (37.1 C) (Oral)   Ht 5\' 9"  (1.753 m)   Wt 190 lb 3.2 oz (86.3 kg)   SpO2 97%   BMI 28.09 kg/m   GEN: Pleasant, interactive, well-appearing; in no acute distress. HEENT:  Normocephalic and atraumatic. PERRLA. Sclera white. Nasal turbinates pink, moist and patent bilaterally. No rhinorrhea present. Oropharynx pink and moist, without exudate or edema. No lesions, ulcerations, or postnasal drip. Mallampati III NECK:  Supple w/ fair ROM. No JVD present. Normal carotid impulses w/o bruits. Thyroid symmetrical with no goiter or nodules palpated. No lymphadenopathy.   CV: RRR, no m/r/g, no peripheral edema. Pulses intact, +2 bilaterally. No cyanosis, pallor or clubbing. PULMONARY:  Unlabored, regular breathing. Clear bilaterally A&P w/o wheezes/rales/rhonchi. No accessory muscle use.  GI: BS present and normoactive. Soft, non-tender to palpation. No  organomegaly or masses detected.  MSK: No erythema, warmth or tenderness. Cap refil <2 sec all extrem. No deformities or joint swelling noted.  Neuro: A/Ox3. No focal deficits noted.   Skin: Warm, no lesions or rashe Psych: Normal affect and behavior. Judgement and thought content appropriate.     Lab Results:  CBC    Component Value Date/Time   WBC 4.3 03/23/2023 0929   RBC 4.27 03/23/2023 0929   HGB 13.9 03/23/2023 0929   HCT 41.0 03/23/2023 0929   PLT 230 03/23/2023 0929   MCV 96.0 03/23/2023 0929   MCH 32.6 03/23/2023 0929   MCHC 33.9 03/23/2023 0929   RDW 12.6 03/23/2023 0929   LYMPHSABS 1,372 03/23/2023 0929   MONOABS 312 07/13/2016 1050   EOSABS 181 03/23/2023 0929   BASOSABS 39 03/23/2023 0929    BMET    Component Value Date/Time   NA 140 03/23/2023 0929   K 4.6 03/23/2023 0929   CL 105 03/23/2023 0929   CO2 26 03/23/2023 0929   GLUCOSE 86 03/23/2023 0929   BUN 14 03/23/2023 0929   CREATININE 0.87 03/23/2023 0929   CALCIUM 9.2 03/23/2023 0929   GFRNONAA  85 12/20/2020 1006   GFRAA 98 12/20/2020 1006    BNP No results found for: "BNP"   Imaging:  No results found.  Administration History     None           No data to display          No results found for: "NITRICOXIDE"      Assessment & Plan:   Moderate obstructive sleep apnea Moderate obstructive sleep apnea with AHI 17/h, SpO2 low 75%.  Reviewed risks of untreated sleep apnea.  Discussed potential treatment options.  Shared decision to move forward with CPAP therapy.  Orders placed for auto CPAP 5-15 cmH2O, mask of choice and heated humidity.  Educated on proper use/care of device.  Risk/benefits reviewed.  Healthy weight loss encouraged.  Safe driving practices reviewed.  Patient Instructions  Start CPAP 5-15 cmH2O, mask of choice, heated humidity every night, minimum of 4-6 hours a night.  Change equipment as directed. Wash your tubing with warm soap and water daily, hang to dry.  Wash humidifier portion weekly. Use bottled, distilled water and change daily Be aware of reduced alertness and do not drive or operate heavy machinery if experiencing this or drowsiness.  Exercise encouraged, as tolerated. Healthy weight management discussed.  Avoid or decrease alcohol consumption and medications that make you more sleepy, if possible. Notify if persistent daytime sleepiness occurs even with consistent use of PAP therapy.  Change CPAP supplies as directed or as needed... Every month Mask cushions and/or nasal pillows CPAP machine filters Every 3 months Mask frame (not including the headgear) CPAP tubing Every 6 months Mask headgear Chin strap (if applicable) Humidifier water tub  We discussed how untreated sleep apnea puts an individual at risk for cardiac arrhthymias, pulm HTN, DM, stroke and increases their risk for daytime accidents. We also briefly reviewed treatment options including weight loss, side sleeping position, oral appliance, CPAP therapy or referral to ENT for possible surgical options  Follow up in 10-12 weeks with Katie Jesusa Stenerson,NP in Friday PM virtual clinic slot, or sooner, if needed. If you haven't heard from the medical supply company in the next 2-3 weeks, please call me     I spent 32 minutes of dedicated to the care of this patient on the date of this encounter to include pre-visit review of records, face-to-face time with the patient discussing conditions above, post visit ordering of testing, clinical documentation with the electronic health record, making appropriate referrals as documented, and communicating necessary findings to members of the patients care team.  Noemi Chapel, NP 10/18/2023  Pt aware and understands NP's role.

## 2023-10-18 NOTE — Patient Instructions (Addendum)
Start CPAP 5-15 cmH2O, mask of choice, heated humidity every night, minimum of 4-6 hours a night.  Change equipment as directed. Wash your tubing with warm soap and water daily, hang to dry. Wash humidifier portion weekly. Use bottled, distilled water and change daily Be aware of reduced alertness and do not drive or operate heavy machinery if experiencing this or drowsiness.  Exercise encouraged, as tolerated. Healthy weight management discussed.  Avoid or decrease alcohol consumption and medications that make you more sleepy, if possible. Notify if persistent daytime sleepiness occurs even with consistent use of PAP therapy.  Change CPAP supplies as directed or as needed... Every month Mask cushions and/or nasal pillows CPAP machine filters Every 3 months Mask frame (not including the headgear) CPAP tubing Every 6 months Mask headgear Chin strap (if applicable) Humidifier water tub  We discussed how untreated sleep apnea puts an individual at risk for cardiac arrhthymias, pulm HTN, DM, stroke and increases their risk for daytime accidents. We also briefly reviewed treatment options including weight loss, side sleeping position, oral appliance, CPAP therapy or referral to ENT for possible surgical options  Follow up in 10-12 weeks with Katie Breanna Shorkey,NP in Friday PM virtual clinic slot, or sooner, if needed. If you haven't heard from the medical supply company in the next 2-3 weeks, please call me

## 2023-10-18 NOTE — Assessment & Plan Note (Signed)
Moderate obstructive sleep apnea with AHI 17/h, SpO2 low 75%.  Reviewed risks of untreated sleep apnea.  Discussed potential treatment options.  Shared decision to move forward with CPAP therapy.  Orders placed for auto CPAP 5-15 cmH2O, mask of choice and heated humidity.  Educated on proper use/care of device.  Risk/benefits reviewed.  Healthy weight loss encouraged.  Safe driving practices reviewed.  Patient Instructions  Start CPAP 5-15 cmH2O, mask of choice, heated humidity every night, minimum of 4-6 hours a night.  Change equipment as directed. Wash your tubing with warm soap and water daily, hang to dry. Wash humidifier portion weekly. Use bottled, distilled water and change daily Be aware of reduced alertness and do not drive or operate heavy machinery if experiencing this or drowsiness.  Exercise encouraged, as tolerated. Healthy weight management discussed.  Avoid or decrease alcohol consumption and medications that make you more sleepy, if possible. Notify if persistent daytime sleepiness occurs even with consistent use of PAP therapy.  Change CPAP supplies as directed or as needed... Every month Mask cushions and/or nasal pillows CPAP machine filters Every 3 months Mask frame (not including the headgear) CPAP tubing Every 6 months Mask headgear Chin strap (if applicable) Humidifier water tub  We discussed how untreated sleep apnea puts an individual at risk for cardiac arrhthymias, pulm HTN, DM, stroke and increases their risk for daytime accidents. We also briefly reviewed treatment options including weight loss, side sleeping position, oral appliance, CPAP therapy or referral to ENT for possible surgical options  Follow up in 10-12 weeks with Katie Kajuana Shareef,NP in Friday PM virtual clinic slot, or sooner, if needed. If you haven't heard from the medical supply company in the next 2-3 weeks, please call me

## 2024-01-04 ENCOUNTER — Encounter: Payer: Self-pay | Admitting: Adult Health

## 2024-01-04 ENCOUNTER — Telehealth (INDEPENDENT_AMBULATORY_CARE_PROVIDER_SITE_OTHER): Payer: 59 | Admitting: Adult Health

## 2024-01-04 DIAGNOSIS — G4733 Obstructive sleep apnea (adult) (pediatric): Secondary | ICD-10-CM

## 2024-01-04 NOTE — Progress Notes (Signed)
Patient canceled ov

## 2024-01-04 NOTE — Progress Notes (Deleted)
Error- opened. No show for visit

## 2024-01-09 NOTE — Telephone Encounter (Signed)
Sleep study results were discussed with patient

## 2024-01-11 ENCOUNTER — Telehealth: Payer: Self-pay | Admitting: Internal Medicine

## 2024-01-11 NOTE — Telephone Encounter (Signed)
Alfred Berry  [phone]  Theodoro Grist dropped off a form to be completed and signed. 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

## 2024-01-14 NOTE — Telephone Encounter (Signed)
Alfred Berry came in and pick up forms and schedule CPE

## 2024-01-14 NOTE — Telephone Encounter (Signed)
LVM that his forms are ready for pick and he needs to schedule his physical.

## 2024-03-03 ENCOUNTER — Encounter: Payer: Self-pay | Admitting: Nurse Practitioner

## 2024-03-03 ENCOUNTER — Ambulatory Visit: Admitting: Nurse Practitioner

## 2024-03-03 VITALS — BP 140/100 | HR 67 | Ht 68.0 in | Wt 189.8 lb

## 2024-03-03 DIAGNOSIS — G4733 Obstructive sleep apnea (adult) (pediatric): Secondary | ICD-10-CM | POA: Diagnosis not present

## 2024-03-03 NOTE — Patient Instructions (Addendum)
 Continue to use CPAP every night, minimum of 4-6 hours a night.  Change equipment as directed. Wash your tubing with warm soap and water daily, hang to dry. Wash humidifier portion weekly. Use bottled, distilled water and change daily Be aware of reduced alertness and do not drive or operate heavy machinery if experiencing this or drowsiness.  Exercise encouraged, as tolerated. Healthy weight management discussed.  Avoid or decrease alcohol consumption and medications that make you more sleepy, if possible. Notify if persistent daytime sleepiness occurs even with consistent use of PAP therapy.  Great job on CPAP use!  Follow up in 6 months with Katie Kirah Stice,NP, or sooner, if needed

## 2024-03-03 NOTE — Progress Notes (Unsigned)
 @Patient  ID: Alfred Berry, male    DOB: Sep 28, 1966, 58 y.o.   MRN: [id number]  Chief Complaint  Patient presents with   Follow-up    Referring provider: Margaree Mackintosh, MD  HPI: 58 year old male, never smoker followed for sleep apnea. Past medical history significant for BPH, allergic rhinitis, anxiety, HLD.  TEST/EVENTS:  08/07/2023 HST: AHI 17.2/h, SpO2 low 75%  07/03/2023: OV with Vibha Ferdig NP for sleep consult, referred by Dr. Lenord Fellers. Wakes up often throughout the night. Has loud snoring. Daytime fatigue depends on how he slept the night before. If he does get tired, tends to be later in the day. He has been told that he has teeth grinding and was recently provided with a mouth guard for this. Does have some restless leg. No morning headaches, dry mouth, drowsy driving, sleep parasomnias/paralysis. No history of narcolepsy or cataplexy.  He goes to bed between 10-11 pm. Falls asleep within 15-20 minutes. Wakes 4-5 times a night. Gets up between 6:30-7:30 am. He will occasionally take a xanax to help him fall asleep but tries to avoid this because his snoring worsens. 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 Never had a previous sleep study. No O2 use. No history of cardiac disease, stroke, DM. Never smoker. 1-2 glass of wine a day. During the school year, drinks about 4-6 cups of caffeinated coffee a day. Lives with his wife. Works as a Airline pilot at [employer]. No significant family history reported. [place] 2  10/18/2023: Today - follow up Patient presents today for follow-up to discuss sleep study results which revealed moderate sleep apnea.  He feels unchanged compared to our last visit.  Feels like he does not sleep well at night.  Has some daytime fatigue symptoms but not every day.  Does have loud snoring.  No issues with drowsy driving, sleep parasomnia/paralysis.  Wants to discuss treatment options.  Allergies  Allergen Reactions   Lincomycin Hcl Hives     Immunization History  Administered Date(s) Administered   Influenza Split 11/09/2011   Influenza,inj,Quad PF,6+ Mos 12/24/2015, 09/15/2016, 10/18/2018, 09/23/2019, 10/01/2020, 10/14/2021   Influenza-Unspecified 09/11/2017   PFIZER Comirnaty(Gray Top)Covid-19 Tri-Sucrose Vaccine 05/19/2021   PFIZER(Purple Top)SARS-COV-2 Vaccination 02/12/2020, 03/03/2020, 10/29/2020   Tdap 09/19/2009, 12/02/2018    Past Medical History:  Diagnosis Date   Allergy    SERASONAL   Hyperlipidemia    Hypertension    borderline   Moderate obstructive sleep apnea 10/18/2023    Tobacco History: Social History   Tobacco Use  Smoking Status Never   Passive exposure: Past  Smokeless Tobacco Never   Counseling given: Not Answered   Outpatient Medications Prior to Visit  Medication Sig Dispense Refill   ALPRAZolam (XANAX) 0.5 MG tablet TAKE 1 TABLET BY MOUTH TWO TIMES A DAY AS NEEDED 60 tablet 1   D 1000 25 MCG (1000 UT) capsule Take 1,000 Units by mouth once a week.     finasteride (PROSCAR) 5 MG tablet Take 5 mg by mouth daily.     meloxicam (MOBIC) 15 MG tablet Take 1 tablet (15 mg total) by mouth daily. 30 tablet 0   mometasone (NASONEX) 50 MCG/ACT nasal spray SPRAY TWO SPRAYS IN EACH NOSTRIL ONCE DAILY 17 g PRN   Multiple Vitamins-Minerals (MULTIVITAMIN WITH MINERALS) tablet Take 1 tablet by mouth daily.     sertraline (ZOLOFT) 100 MG tablet TAKE 1 TABLET BY MOUTH DAILY 90 tablet 3   simvastatin (ZOCOR) 20 MG  tablet TAKE 1 TABLET BY MOUTH EVERY NIGHT AT BEDTIME 90 tablet 3   tadalafil (CIALIS) 20 MG tablet Please specify directions, refills and quantity 10 tablet 0   No facility-administered medications prior to visit.     Review of Systems:   Constitutional: No night sweats, fevers, chills, or lassitude. +occasional fatigue, weight gain  HEENT: No headaches, difficulty swallowing, tooth/dental problems, or sore throat. No itching, ear ache. +occasional allergy symptoms CV:  No chest  pain, orthopnea, PND, swelling in lower extremities, anasarca, dizziness, palpitations, syncope Resp: +snoring. No shortness of breath with exertion or at rest. No excess mucus or change in color of mucus. No productive or non-productive. No hemoptysis. No wheezing.  No chest wall deformity GI:  No heartburn, indigestion GU: No nocturia Skin: No rash, lesions, ulcerations MSK:  No joint pain or swelling.   Neuro: No dizziness or lightheadedness. +restless leg Psych: No depression or anxiety. Mood stable. +sleep disturbance     Physical Exam:  BP (!) 140/100 (BP Location: Left Arm, Patient Position: Sitting, Cuff Size: Normal)   Pulse 67   Ht 5\' 8"  (1.727 m)   Wt 189 lb 12.8 oz (86.1 kg)   SpO2 95%   BMI 28.86 kg/m   GEN: Pleasant, interactive, well-appearing; in no acute distress. HEENT:  Normocephalic and atraumatic. PERRLA. Sclera white. Nasal turbinates pink, moist and patent bilaterally. No rhinorrhea present. Oropharynx pink and moist, without exudate or edema. No lesions, ulcerations, or postnasal drip. Mallampati III NECK:  Supple w/ fair ROM. No JVD present. Normal carotid impulses w/o bruits. Thyroid symmetrical with no goiter or nodules palpated. No lymphadenopathy.   CV: RRR, no m/r/g, no peripheral edema. Pulses intact, +2 bilaterally. No cyanosis, pallor or clubbing. PULMONARY:  Unlabored, regular breathing. Clear bilaterally A&P w/o wheezes/rales/rhonchi. No accessory muscle use.  GI: BS present and normoactive. Soft, non-tender to palpation. No organomegaly or masses detected.  MSK: No erythema, warmth or tenderness. Cap refil <2 sec all extrem. No deformities or joint swelling noted.  Neuro: A/Ox3. No focal deficits noted.   Skin: Warm, no lesions or rashe Psych: Normal affect and behavior. Judgement and thought content appropriate.     Lab Results:  CBC    Component Value Date/Time   WBC 4.3 03/23/2023 0929   RBC 4.27 03/23/2023 0929   HGB 13.9 03/23/2023  0929   HCT 41.0 03/23/2023 0929   PLT 230 03/23/2023 0929   MCV 96.0 03/23/2023 0929   MCH 32.6 03/23/2023 0929   MCHC 33.9 03/23/2023 0929   RDW 12.6 03/23/2023 0929   LYMPHSABS 1,372 03/23/2023 0929   MONOABS 312 07/13/2016 1050   EOSABS 181 03/23/2023 0929   BASOSABS 39 03/23/2023 0929    BMET    Component Value Date/Time   NA 140 03/23/2023 0929   K 4.6 03/23/2023 0929   CL 105 03/23/2023 0929   CO2 26 03/23/2023 0929   GLUCOSE 86 03/23/2023 0929   BUN 14 03/23/2023 0929   CREATININE 0.87 03/23/2023 0929   CALCIUM 9.2 03/23/2023 0929   GFRNONAA 85 12/20/2020 1006   GFRAA 98 12/20/2020 1006    BNP No results found for: "BNP"   Imaging:  No results found.  Administration History     None           No data to display          No results found for: "NITRICOXIDE"      Assessment & Plan:   No problem-specific Assessment &  Plan notes found for this encounter.    I spent 32 minutes of dedicated to the care of this patient on the date of this encounter to include pre-visit review of records, face-to-face time with the patient discussing conditions above, post visit ordering of testing, clinical documentation with the electronic health record, making appropriate referrals as documented, and communicating necessary findings to members of the patients care team.  Noemi Chapel, NP 03/03/2024  Pt aware and understands NP's role.

## 2024-03-05 ENCOUNTER — Encounter: Payer: Self-pay | Admitting: Nurse Practitioner

## 2024-03-05 NOTE — Assessment & Plan Note (Signed)
 Moderate OSA on CPAP.  Good compliance and excellent control.  Receiving benefit from use.  Understands proper care/use of device.  For some reason, his machine stopped working last night.  He is going to contact his DME for troubleshooting.  Aware of risks of untreated sleep apnea.  Encouraged to continue utilizing therapy.  Safe during practices reviewed.  Patient Instructions  Continue to use CPAP every night, minimum of 4-6 hours a night.  Change equipment as directed. Wash your tubing with warm soap and water daily, hang to dry. Wash humidifier portion weekly. Use bottled, distilled water and change daily Be aware of reduced alertness and do not drive or operate heavy machinery if experiencing this or drowsiness.  Exercise encouraged, as tolerated. Healthy weight management discussed.  Avoid or decrease alcohol consumption and medications that make you more sleepy, if possible. Notify if persistent daytime sleepiness occurs even with consistent use of PAP therapy.  Great job on CPAP use!  Follow up in 6 months with Katie Taniya Dasher,NP, or sooner, if needed

## 2024-04-09 NOTE — Progress Notes (Signed)
 Annual Wellness Visit   Patient Care Team: Sylvan Evener, MD as PCP - General (Internal Medicine)  Visit Date: 04/25/24   Chief Complaint  Patient presents with   Annual Exam   Subjective:  Patient: Alfred Berry, Male DOB: 1966/08/19, 58 y.o. MRN: [id number] Vitals:   04/25/24 1058 04/25/24 1131  BP: (!) 130/90 (!) 130/90   Alfred Berry is a 58 y.o. Male who presents today for his Annual Wellness Visit. Patient has Hyperlipidemia; Allergic Rhinitis; BPH (Benign Prostatic Hyperplasia); History of Anxiety; Loud Snoring; Restless Leg Syndrome; and Moderate Obstructive Sleep Apnea.  Blood Pressure: elevated today at 130/90 initially. On recheck 33 minutes later was still 130/90.    History of Hyperlipidemia treated with Simvastatin  20 mg nightly. 04/22/2024 Lipid Panel, compared to 03/2023: LDL 115, decreased from 122.   History of Glucose Intolerance managed with diet and exercise with 04/22/2024 HgbA1c: 5.7, no change from 03/2023. Discussed diet management - reducing carbohydrates.    History of Anxiety treated with Xanax  0.5 mg as needed twice daily and Zoloft  100 mg daily.   History of Allergic Rhinitis treated with Nasonex  nasal spray into both nostrils daily.   History of Plantar Fasciitis 2017 seen by Podiatrist; Lumbar Radiculopathy 2018 resolved after PT, steroids, and muscle relaxants, and he continues PT and massage therapy as needed; Musculoskeletal Pain treated with Meloxicam  15 mg daily.   History of Snoring; Moderate Obstructive Sleep Apnea treated with CPAP.   Labs 04/22/2024 CBC: WNL CMP: WNL   Colonoscopy 06/19/2022 removed one 2 mm polyp from ascending colon - found to be a tubular adenoma, negative for high-grade dysplasia or carcinoma per pathology; exam otherwise normal with repeat recommendation of 2030.  History of Benign Prostatic Hyperplasia treated with Finasteride 5 mg daily. Followed by [facility]. PSA  1.68  04/22/2024. History of Erectile  Dysfunction treated with Cialis 20 mg as needed.   Vaccine Counseling: Due for Covid-19 and Shingles 1/2; UTD on Flu and Tdap. Past Medical History:  Diagnosis Date   Allergy    SERASONAL   Hyperlipidemia    Hypertension    borderline   Moderate obstructive sleep apnea 10/18/2023   Medical/Surgical History Narrative:  Allergic/Intolerant to: Lincomycin HCL - hives   2016 - Herpes Zoster, Left Scalp & Face in February. Saw Ophthalmologist and did not have ophthalmic involvement. Was treated with Valtrex  and this resolved.   1971 - Eyelid Reconstruction at age 14 Family History  Problem Relation Age of Onset   Heart disease Father    Colon cancer Neg Hx    Esophageal cancer Neg Hx    Rectal cancer Neg Hx    Stomach cancer Neg Hx    Colon polyps Neg Hx    Crohn's disease Neg Hx    Social History   Social History Narrative   Works as a Community education officer at [employer]. Married - wife is an Psychologist, educational history professor at [employer]. No children. Non-smoker, social alcohol consumption.       Fhx:    There is no Family History of GI Cancers, Colon Polyps, or Crohn's Disease.    Father was adopted so not much is known about paternal family history, w/ Hypertension and CABG in 2002.    Mother in good health as far as is known.   Review of Systems  Constitutional:  Negative for chills, fever, malaise/fatigue and weight loss.  HENT:  Negative for hearing loss, sinus pain and sore throat.   Respiratory:  Negative for  cough, hemoptysis and shortness of breath.   Cardiovascular:  Negative for chest pain, palpitations, leg swelling and PND.  Gastrointestinal:  Negative for abdominal pain, constipation, diarrhea, heartburn, nausea and vomiting.  Genitourinary:  Negative for dysuria, frequency and urgency.  Musculoskeletal:  Negative for back pain, myalgias and neck pain.  Skin:  Negative for itching and rash.  Neurological:  Negative for dizziness, tingling, seizures and headaches.   Endo/Heme/Allergies:  Negative for polydipsia.  Psychiatric/Behavioral:  Negative for depression. The patient is not nervous/anxious.     Objective:  Vitals: BP (!) 130/90   Pulse 70   Ht 5\' 8"  (1.727 m)   Wt 186 lb (84.4 kg)   SpO2 96%   BMI 28.28 kg/m  Physical Exam Vitals and nursing note reviewed.  Constitutional:      General: He is awake. He is not in acute distress.    Appearance: Normal appearance. He is not ill-appearing or toxic-appearing.  HENT:     Head: Normocephalic and atraumatic.     Right Ear: Tympanic membrane, ear canal and external ear normal.     Left Ear: Tympanic membrane, ear canal and external ear normal.     Mouth/Throat:     Pharynx: Oropharynx is clear.  Eyes:     Extraocular Movements: Extraocular movements intact.     Pupils: Pupils are equal, round, and reactive to light.  Neck:     Thyroid: No thyroid mass, thyromegaly or thyroid tenderness.     Vascular: No carotid bruit.  Cardiovascular:     Rate and Rhythm: Normal rate and regular rhythm. No extrasystoles are present.    Pulses:          Dorsalis pedis pulses are 2+ on the right side and 2+ on the left side.       Posterior tibial pulses are 2+ on the right side and 2+ on the left side.     Heart sounds: Normal heart sounds. No murmur heard.    No friction rub. No gallop.  Pulmonary:     Effort: Pulmonary effort is normal.     Breath sounds: Normal breath sounds. No decreased breath sounds, wheezing, rhonchi or rales.  Chest:     Chest wall: No mass.  Abdominal:     Palpations: Abdomen is soft. There is no hepatomegaly, splenomegaly or mass.     Tenderness: There is no abdominal tenderness.     Hernia: No hernia is present.  Musculoskeletal:     Cervical back: Normal range of motion.     Right lower leg: No edema.     Left lower leg: No edema.  Lymphadenopathy:     Cervical: No cervical adenopathy.     Upper Body:     Right upper body: No supraclavicular adenopathy.     Left  upper body: No supraclavicular adenopathy.  Skin:    General: Skin is warm and dry.  Neurological:     General: No focal deficit present.     Mental Status: He is alert and oriented to person, place, and time. Mental status is at baseline.     Cranial Nerves: Cranial nerves 2-12 are intact.     Sensory: Sensation is intact.     Motor: Motor function is intact.     Coordination: Coordination is intact.     Gait: Gait is intact.     Deep Tendon Reflexes: Reflexes are normal and symmetric.  Psychiatric:        Attention and Perception: Attention normal.  Mood and Affect: Mood normal.        Speech: Speech normal.        Behavior: Behavior normal. Behavior is cooperative.        Thought Content: Thought content normal.        Cognition and Memory: Cognition and memory normal.        Judgment: Judgment normal.   Most Recent Fall Risk Assessment:    03/26/2023    3:05 PM  Fall Risk   Falls in the past year? 0  Number falls in past yr: 0  Injury with Fall? 0  Risk for fall due to : No Fall Risks  Follow up Falls prevention discussed   Most Recent Depression Screenings:    04/25/2024   11:05 AM 03/26/2023    3:05 PM  PHQ 2/9 Scores  PHQ - 2 Score 1 0   Results:  Studies Obtained And Personally Reviewed By Me:  Colonoscopy 06/19/2022 removed one 2 mm polyp from ascending colon - found to be a tubular adenoma, negative for high-grade dysplasia or carcinoma per pathology; exam otherwise normal.  Labs:     Component Value Date/Time   NA 139 04/22/2024 0933   K 4.3 04/22/2024 0933   CL 103 04/22/2024 0933   CO2 28 04/22/2024 0933   GLUCOSE 98 04/22/2024 0933   BUN 14 04/22/2024 0933   CREATININE 0.95 04/22/2024 0933   CALCIUM 9.0 04/22/2024 0933   PROT 7.0 04/22/2024 0933   ALBUMIN 4.5 07/13/2016 1050   AST 21 04/22/2024 0933   ALT 18 04/22/2024 0933   ALKPHOS 29 (L) 07/13/2016 1050   BILITOT 0.6 04/22/2024 0933   GFRNONAA 85 12/20/2020 1006   GFRAA 98 12/20/2020  1006    Lab Results  Component Value Date   WBC 3.9 04/22/2024   HGB 14.7 04/22/2024   HCT 44.7 04/22/2024   MCV 97.0 04/22/2024   PLT 255 04/22/2024   Lab Results  Component Value Date   CHOL 196 04/22/2024   HDL 61 04/22/2024   LDLCALC 115 (H) 04/22/2024   TRIG 102 04/22/2024   CHOLHDL 3.2 04/22/2024   Lab Results  Component Value Date   HGBA1C 5.7 (H) 04/22/2024     Lab Results  Component Value Date   PSA 1.68 04/22/2024   PSA 1.70 03/23/2023   PSA 1.46 12/23/2021     Assessment & Plan:   Orders Placed This Encounter  Procedures   POCT URINALYSIS DIP (CLINITEK)  Other Labs Reviewed today: CBC: WNL CMP: WNL   Elevated Blood Pressure today at 130/90 initially. On recheck 33 minutes later was still 130/90.  Patient will monitor at home and let me know if persistently elevated. Diastolic of 90 is borderline elevated.  Hyperlipidemia treated with Simvastatin  20 mg nightly. 04/22/2024 Lipid Panel, compared to 03/2023: LDL 115, decreased from 122. Ideally would like LDL less than 100.  Glucose Intolerance managed with diet and exercise with 04/22/2024 HgbA1c: 5.7%, no change from 03/2023. Discussed diet management - reducing carbohydrates.    Anxiety treated with Xanax  0.5 mg as needed twice daily and Zoloft  100 mg daily. Symptoms controlled with these meds.  Allergic Rhinitis treated with Nasonex  nasal spray into both nostrils daily.   History of Plantar Fasciitis 2017 seen by Podiatrist.  Lumbar Radiculopathy 2018 resolved after PT, steroids, and muscle relaxants, and he continues PT and massage therapy as needed.  Musculoskeletal Pain treated with Meloxicam  15 mg daily.   Snoring; Moderate Obstructive Sleep Apnea treated  with CPAP.   Colonoscopy 06/19/2022 removed one 2 mm polyp from ascending colon - found to be a tubular adenoma, negative for high-grade dysplasia or carcinoma per pathology; exam otherwise normal with repeat recommendation of 2030.  Benign  Prostatic Hyperplasia treated with Finasteride 5 mg daily. Followed by [facility]. PSA  1.68  04/22/2024.   Erectile Dysfunction treated with Cialis 20 mg as needed.   Vaccine Counseling: Due for Covid-19 and Shingles 1/2; UTD on Flu and Tdap.   Annual wellness visit done today including the all of the following: Reviewed patient's Family Medical History Reviewed and updated list of patient's medical providers Assessment of cognitive impairment was done Assessed patient's functional ability Established a written schedule for health screening services Health Risk Assessent Completed and Reviewed  Discussed health benefits of physical activity, and encouraged him to engage in regular exercise appropriate for his age and condition.    I,Emily Lagle,acting as a Neurosurgeon for Sylvan Evener, MD.,have documented all relevant documentation on the behalf of Sylvan Evener, MD,as directed by  Sylvan Evener, MD while in the presence of Sylvan Evener, MD.   I, Sylvan Evener, MD, have reviewed all documentation for this visit. The documentation on 05/01/24 for the exam, diagnosis, procedures, and orders are all accurate and complete.

## 2024-04-17 ENCOUNTER — Other Ambulatory Visit: Payer: Self-pay

## 2024-04-17 MED ORDER — SERTRALINE HCL 100 MG PO TABS: 100.0000 mg | ORAL_TABLET | Freq: Every day | ORAL | 3 refills | Status: AC

## 2024-04-22 ENCOUNTER — Other Ambulatory Visit: Payer: Self-pay

## 2024-04-22 DIAGNOSIS — N400 Enlarged prostate without lower urinary tract symptoms: Secondary | ICD-10-CM

## 2024-04-22 DIAGNOSIS — E785 Hyperlipidemia, unspecified: Secondary | ICD-10-CM

## 2024-04-22 DIAGNOSIS — E7800 Pure hypercholesterolemia, unspecified: Secondary | ICD-10-CM

## 2024-04-22 DIAGNOSIS — R7302 Impaired glucose tolerance (oral): Secondary | ICD-10-CM

## 2024-04-22 DIAGNOSIS — Z0000 Encounter for general adult medical examination without abnormal findings: Secondary | ICD-10-CM

## 2024-04-23 ENCOUNTER — Ambulatory Visit: Payer: Self-pay | Admitting: Internal Medicine

## 2024-04-23 LAB — HEMOGLOBIN A1C
Hgb A1c MFr Bld: 5.7 % — ABNORMAL HIGH (ref <5.7)
Mean Plasma Glucose: 117 mg/dL
eAG (mmol/L): 6.5 mmol/L

## 2024-04-23 LAB — CBC WITH DIFFERENTIAL/PLATELET
Absolute Lymphocytes: 1505 {cells}/uL (ref 850–3900)
Absolute Monocytes: 300 {cells}/uL (ref 200–950)
Basophils Absolute: 39 {cells}/uL (ref 0–200)
Basophils Relative: 1 %
Eosinophils Absolute: 160 {cells}/uL (ref 15–500)
Eosinophils Relative: 4.1 %
HCT: 44.7 % (ref 38.5–50.0)
Hemoglobin: 14.7 g/dL (ref 13.2–17.1)
MCH: 31.9 pg (ref 27.0–33.0)
MCHC: 32.9 g/dL (ref 32.0–36.0)
MCV: 97 fL (ref 80.0–100.0)
MPV: 10 fL (ref 7.5–12.5)
Monocytes Relative: 7.7 %
Neutro Abs: 1895 {cells}/uL (ref 1500–7800)
Neutrophils Relative %: 48.6 %
Platelets: 255 10*3/uL (ref 140–400)
RBC: 4.61 10*6/uL (ref 4.20–5.80)
RDW: 13 % (ref 11.0–15.0)
Total Lymphocyte: 38.6 %
WBC: 3.9 10*3/uL (ref 3.8–10.8)

## 2024-04-23 LAB — COMPLETE METABOLIC PANEL WITHOUT GFR
AG Ratio: 1.7 (calc) (ref 1.0–2.5)
ALT: 18 U/L (ref 9–46)
AST: 21 U/L (ref 10–35)
Albumin: 4.4 g/dL (ref 3.6–5.1)
Alkaline phosphatase (APISO): 33 U/L — ABNORMAL LOW (ref 35–144)
BUN: 14 mg/dL (ref 7–25)
CO2: 28 mmol/L (ref 20–32)
Calcium: 9 mg/dL (ref 8.6–10.3)
Chloride: 103 mmol/L (ref 98–110)
Creat: 0.95 mg/dL (ref 0.70–1.30)
Globulin: 2.6 g/dL (ref 1.9–3.7)
Glucose, Bld: 98 mg/dL (ref 65–99)
Potassium: 4.3 mmol/L (ref 3.5–5.3)
Sodium: 139 mmol/L (ref 135–146)
Total Bilirubin: 0.6 mg/dL (ref 0.2–1.2)
Total Protein: 7 g/dL (ref 6.1–8.1)

## 2024-04-23 LAB — LIPID PANEL
Cholesterol: 196 mg/dL (ref <200)
HDL: 61 mg/dL (ref >=40)
LDL Cholesterol (Calc): 115 mg/dL — ABNORMAL HIGH
Non-HDL Cholesterol (Calc): 135 mg/dL — ABNORMAL HIGH (ref <130)
Total CHOL/HDL Ratio: 3.2 (calc) (ref <5.0)
Triglycerides: 102 mg/dL (ref <150)

## 2024-04-23 LAB — PSA: PSA: 1.68 ng/mL (ref <=4.00)

## 2024-04-25 ENCOUNTER — Encounter: Payer: Self-pay | Admitting: Internal Medicine

## 2024-04-25 ENCOUNTER — Ambulatory Visit (INDEPENDENT_AMBULATORY_CARE_PROVIDER_SITE_OTHER): Payer: Self-pay | Admitting: Internal Medicine

## 2024-04-25 VITALS — BP 130/90 | HR 70 | Ht 68.0 in | Wt 186.0 lb

## 2024-04-25 DIAGNOSIS — N400 Enlarged prostate without lower urinary tract symptoms: Secondary | ICD-10-CM

## 2024-04-25 DIAGNOSIS — Z8659 Personal history of other mental and behavioral disorders: Secondary | ICD-10-CM | POA: Diagnosis not present

## 2024-04-25 DIAGNOSIS — G4733 Obstructive sleep apnea (adult) (pediatric): Secondary | ICD-10-CM

## 2024-04-25 DIAGNOSIS — J309 Allergic rhinitis, unspecified: Secondary | ICD-10-CM

## 2024-04-25 DIAGNOSIS — Z0000 Encounter for general adult medical examination without abnormal findings: Secondary | ICD-10-CM

## 2024-04-25 DIAGNOSIS — E7800 Pure hypercholesterolemia, unspecified: Secondary | ICD-10-CM | POA: Diagnosis not present

## 2024-05-01 NOTE — Patient Instructions (Addendum)
 Monitor blood pressure at home and let me know if persistently elevated. Watch diet and try to get regular exercise. Vaccines discussed. Should get Shingrix vaccines (2). Consider covid vaccine this Fall. Tetanus immunization is up to date.

## 2024-05-12 ENCOUNTER — Other Ambulatory Visit: Payer: Self-pay

## 2024-05-12 MED ORDER — SIMVASTATIN 20 MG PO TABS: 20.0000 mg | ORAL_TABLET | Freq: Every day | ORAL | 3 refills | Status: AC

## 2024-07-21 ENCOUNTER — Other Ambulatory Visit: Payer: Self-pay

## 2024-07-22 MED ORDER — ALPRAZOLAM 0.5 MG PO TABS: ORAL_TABLET | ORAL | 1 refills | Status: AC

## 2024-11-20 ENCOUNTER — Encounter: Payer: Self-pay | Admitting: Nurse Practitioner

## 2024-11-20 ENCOUNTER — Ambulatory Visit: Admitting: Nurse Practitioner

## 2024-11-20 VITALS — BP 126/74 | HR 77 | Temp 97.6°F | Ht 69.0 in | Wt 194.6 lb

## 2024-11-20 DIAGNOSIS — G4733 Obstructive sleep apnea (adult) (pediatric): Secondary | ICD-10-CM

## 2024-11-20 DIAGNOSIS — Z23 Encounter for immunization: Secondary | ICD-10-CM

## 2024-11-20 NOTE — Assessment & Plan Note (Signed)
 Moderate OSA on CPAP.  Excellent control.  Reduced benefit and usage due to machine noise. Will attempt to reduce his maximum pressure with auto 5-7 cmH2O (average use 7 cmH2O) and see if this corrects the noise. If not, advised him to go to the DME for troubleshooting. Understands proper care/use of device. Aware of risks of untreated sleep apnea.  Encouraged to increase therapy as able.  Safe during practices reviewed.  Patient Instructions  Continue to use CPAP every night, minimum of 4-6 hours a night.  Change equipment as directed. Wash your tubing with warm soap and water daily, hang to dry. Wash humidifier portion weekly. Use bottled, distilled water and change daily Be aware of reduced alertness and do not drive or operate heavy machinery if experiencing this or drowsiness.  Exercise encouraged, as tolerated. Healthy weight management discussed.  Avoid or decrease alcohol consumption and medications that make you more sleepy, if possible. Notify if persistent daytime sleepiness occurs even with consistent use of PAP therapy.   Adjusted your CPAP settings today If you're still having the CPAP popping sound, call your medical supply company for trouble shooting    Follow up in one year with any sleep providers, or sooner, if needed

## 2024-11-20 NOTE — Patient Instructions (Addendum)
 Continue to use CPAP every night, minimum of 4-6 hours a night.  Change equipment as directed. Wash your tubing with warm soap and water daily, hang to dry. Wash humidifier portion weekly. Use bottled, distilled water and change daily Be aware of reduced alertness and do not drive or operate heavy machinery if experiencing this or drowsiness.  Exercise encouraged, as tolerated. Healthy weight management discussed.  Avoid or decrease alcohol consumption and medications that make you more sleepy, if possible. Notify if persistent daytime sleepiness occurs even with consistent use of PAP therapy.   Adjusted your CPAP settings today If you're still having the CPAP popping sound, call your medical supply company for trouble shooting    Follow up in one year with any sleep providers, or sooner, if needed

## 2024-11-20 NOTE — Progress Notes (Signed)
 @Patient  ID: Alfred Berry, male    DOB: 08/28/1966, 58 y.o.   MRN: [id number]  Chief Complaint  Patient presents with   Obstructive Sleep Apnea    Cpap f/u     Referring provider: Perri Ronal PARAS, MD  HPI: 58 year old male, never smoker followed for sleep apnea. Past medical history significant for BPH, allergic rhinitis, anxiety, HLD.  TEST/EVENTS:  08/07/2023 HST: AHI 17.2/h, SpO2 low 75%  07/03/2023: OV with Norena Bratton NP for sleep consult, referred by Dr. Perri. Wakes up often throughout the night. Has loud snoring. Daytime fatigue depends on how he slept the night before. If he does get tired, tends to be later in the day. He has been told that he has teeth grinding and was recently provided with a mouth guard for this. Does have some restless leg. No morning headaches, dry mouth, drowsy driving, sleep parasomnias/paralysis. No history of narcolepsy or cataplexy.  He goes to bed between 10-11 pm. Falls asleep within 15-20 minutes. Wakes 4-5 times a night. Gets up between 6:30-7:30 am. He will occasionally take a xanax  to help him fall asleep but tries to avoid this because his snoring worsens. 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 Gained 10 pounds in the last two years. Never had a previous sleep study. No O2 use. No history of cardiac disease, stroke, DM. Never smoker. 1-2 glass of wine a day. During the school year, drinks about 4-6 cups of caffeinated coffee a day. Lives with his wife. Works as a airline pilot at [employer]. No significant family history reported. [place] 2  10/18/2023: Ov with Yanixan Mellinger NP for follow-up to discuss sleep study results which revealed moderate sleep apnea.  He feels unchanged compared to our last visit.  Feels like he does not sleep well at night.  Has some daytime fatigue symptoms but not every day.  Does have loud snoring.  No issues with drowsy driving, sleep parasomnia/paralysis.  Wants to discuss treatment options.  03/03/2024: OV with Gabbriella Presswood NP Patient presents today for  follow-up after starting on CPAP therapy.  He is doing very well with it.  Feels like he has gotten adjusted to it.  Been on it for a little over a month now.  Does feel like he is sleeping better at night.  Energy levels are better during the day.  Not snoring with it.  It did cut off last night and does not seem to be working.  He plans to follow-up with [facility] on this.  Wearing a nasal pillow mask.  No issues with drowsy driving, morning headaches or sleep parasomnia/paralysis. 01/31/2024-02/29/2024: CPAP 5-15 cmH2O 27/30 days; 80% greater than 4 hours; average use 7 hours 7 minutes Pressure 95th 10.1 Leaks 95th 5.6 AHI 1.8  11/20/2024: Today - follow up Discussed the use of AI scribe software for clinical note transcription with the patient, who gave verbal consent to proceed.  History of Present Illness Alfred Berry is a 58 year old male with moderate sleep apnea who presents for follow-up regarding CPAP.  He has been experiencing a 'popping sound' in the CPAP tube, which has disrupted his and his wife's sleep, leading to decreased CPAP usage. He recently changed some of the CPAP equipment and received a new kit, but the issue persists. He otherwise doesn't have any issues with leaks, mask fit or pressure.  Before the popping noise started, he was sleeping better and sleep was more restful. He still feels energy is better. No drowsy driving. No residual  snoring with CPAP.     Allergies  Allergen Reactions   Lincomycin Hcl Hives    Immunization History  Administered Date(s) Administered   Influenza Split 11/09/2011   Influenza,inj,Quad PF,6+ Mos 12/24/2015, 09/15/2016, 10/18/2018, 09/23/2019, 10/01/2020, 10/14/2021   Influenza-Unspecified 09/11/2017   PFIZER Comirnaty(Gray Top)Covid-19 Tri-Sucrose Vaccine 05/19/2021   PFIZER(Purple Top)SARS-COV-2 Vaccination 02/12/2020, 03/03/2020, 10/29/2020   Tdap 09/19/2009, 12/02/2018    Past Medical History:  Diagnosis Date    Allergy    SERASONAL   Hyperlipidemia    Hypertension    borderline   Moderate obstructive sleep apnea 10/18/2023    Tobacco History: Social History   Tobacco Use  Smoking Status Never   Passive exposure: Past  Smokeless Tobacco Never   Counseling given: Not Answered   Outpatient Medications Prior to Visit  Medication Sig Dispense Refill   ALPRAZolam  (XANAX ) 0.5 MG tablet TAKE 1 TABLET BY MOUTH TWO TIMES A DAY AS NEEDED 60 tablet 1   D 1000 25 MCG (1000 UT) capsule Take 1,000 Units by mouth once a week.     finasteride (PROSCAR) 5 MG tablet Take 5 mg by mouth daily.     meloxicam  (MOBIC ) 15 MG tablet Take 1 tablet (15 mg total) by mouth daily. 30 tablet 0   mometasone  (NASONEX ) 50 MCG/ACT nasal spray SPRAY TWO SPRAYS IN EACH NOSTRIL ONCE DAILY 17 g PRN   Multiple Vitamins-Minerals (MULTIVITAMIN WITH MINERALS) tablet Take 1 tablet by mouth daily.     sertraline  (ZOLOFT ) 100 MG tablet Take 1 tablet (100 mg total) by mouth daily. 90 tablet 3   simvastatin  (ZOCOR ) 20 MG tablet Take 1 tablet (20 mg total) by mouth at bedtime. 90 tablet 3   tadalafil (CIALIS) 20 MG tablet Please specify directions, refills and quantity 10 tablet 0   No facility-administered medications prior to visit.     Review of Systems: as above    Physical Exam:  BP 126/74   Pulse 77   Temp 97.6 F (36.4 C)   Ht 5' 9 (1.753 m) Comment: pt stated  Wt 194 lb 9.6 oz (88.3 kg)   SpO2 95% Comment: ra  BMI 28.74 kg/m   GEN: Pleasant, interactive, well-appearing; in no acute distress. HEENT:  Normocephalic and atraumatic. PERRLA. Sclera white. Nasal turbinates pink, moist and patent bilaterally. No rhinorrhea present. Oropharynx pink and moist, without exudate or edema. No lesions, ulcerations, or postnasal drip. Mallampati III NECK:  Supple w/ fair ROM.  CV: RRR, no m/r/g PULMONARY:  Unlabored, regular breathing. Clear bilaterally A&P w/o wheezes/rales/rhonchi. No accessory muscle use.  GI: BS present  and normoactive. Soft, non-tender to palpation. Neuro: A/Ox3. No focal deficits noted.   Skin: Warm, no lesions or rashe Psych: Normal affect and behavior. Judgement and thought content appropriate.     Lab Results:  CBC    Component Value Date/Time   WBC 3.9 04/22/2024 0933   RBC 4.61 04/22/2024 0933   HGB 14.7 04/22/2024 0933   HCT 44.7 04/22/2024 0933   PLT 255 04/22/2024 0933   MCV 97.0 04/22/2024 0933   MCH 31.9 04/22/2024 0933   MCHC 32.9 04/22/2024 0933   RDW 13.0 04/22/2024 0933   LYMPHSABS 1,372 03/23/2023 0929   MONOABS 312 07/13/2016 1050   EOSABS 160 04/22/2024 0933   BASOSABS 39 04/22/2024 0933    BMET    Component Value Date/Time   NA 139 04/22/2024 0933   K 4.3 04/22/2024 0933   CL 103 04/22/2024 0933   CO2 28 04/22/2024  0933   GLUCOSE 98 04/22/2024 0933   BUN 14 04/22/2024 0933   CREATININE 0.95 04/22/2024 0933   CALCIUM 9.0 04/22/2024 0933   GFRNONAA 85 12/20/2020 1006   GFRAA 98 12/20/2020 1006    BNP No results found for: BNP   Imaging:  No results found.  Administration History     None           No data to display          No results found for: NITRICOXIDE      Assessment & Plan:   Moderate obstructive sleep apnea Moderate OSA on CPAP.  Excellent control.  Reduced benefit and usage due to machine noise. Will attempt to reduce his maximum pressure with auto 5-7 cmH2O (average use 7 cmH2O) and see if this corrects the noise. If not, advised him to go to the DME for troubleshooting. Understands proper care/use of device. Aware of risks of untreated sleep apnea.  Encouraged to increase therapy as able.  Safe during practices reviewed.  Patient Instructions  Continue to use CPAP every night, minimum of 4-6 hours a night.  Change equipment as directed. Wash your tubing with warm soap and water daily, hang to dry. Wash humidifier portion weekly. Use bottled, distilled water and change daily Be aware of reduced alertness  and do not drive or operate heavy machinery if experiencing this or drowsiness.  Exercise encouraged, as tolerated. Healthy weight management discussed.  Avoid or decrease alcohol consumption and medications that make you more sleepy, if possible. Notify if persistent daytime sleepiness occurs even with consistent use of PAP therapy.   Adjusted your CPAP settings today If you're still having the CPAP popping sound, call your medical supply company for trouble shooting    Follow up in one year with any sleep providers, or sooner, if needed    I spent 25 minutes of dedicated to the care of this patient on the date of this encounter to include pre-visit review of records, face-to-face time with the patient discussing conditions above, post visit ordering of testing, clinical documentation with the electronic health record, making appropriate referrals as documented, and communicating necessary findings to members of the patients care team.  Comer LULLA Rouleau, NP 11/20/2024  Pt aware and understands NP's role.
